# Patient Record
Sex: Male | Born: 1950
Health system: Southern US, Community
[De-identification: ages and names within clinical notes are randomized; demographics above are authoritative.]

## PROBLEM LIST (undated history)

## (undated) DIAGNOSIS — H409 Unspecified glaucoma: Secondary | ICD-10-CM

## (undated) DIAGNOSIS — E789 Disorder of lipoprotein metabolism, unspecified: Secondary | ICD-10-CM

## (undated) HISTORY — PX: CATARACT EXTRACTION, BILATERAL: SHX1313

## (undated) HISTORY — DX: Unspecified glaucoma: H40.9

## (undated) HISTORY — DX: Disorder of lipoprotein metabolism, unspecified: E78.9

---

## 2017-04-18 ENCOUNTER — Encounter: Payer: Self-pay | Admitting: *Deleted

## 2017-04-18 ENCOUNTER — Other Ambulatory Visit: Payer: Self-pay | Admitting: *Deleted

## 2017-04-18 NOTE — Patient Outreach (Signed)
[facility]) Care Management  04/18/2017  Oscar Melton Aug 14, 1950 [id number]   Health Risk Assessment call  Entry for 12/3  Successful outreach call to patient, explained reason for the call..  Patient discussed his medical condition of elevated cholesterol, he took medication until it ran out then he just stopped that was the only medication he was on, denies cost being a issue, discussed his bones began to ache a little.  Patient discussed he did not have a physical on last year only a colonoscopy .  Patient states he does plan to make an appointment with his PCP , Dr.Redding to get a physical in the new year, reviewed importance of follow up . Patient declines any other needs at this time. Will send patient letter with [facility] care management contact information .    Joylene Draft, RN, [place] Management Coordinator  [phone]- Mobile [phone]- Toll Free Main Office

## 2017-07-03 DIAGNOSIS — Z79899 Other long term (current) drug therapy: Secondary | ICD-10-CM | POA: Diagnosis not present

## 2017-07-03 DIAGNOSIS — Z6826 Body mass index (BMI) 26.0-26.9, adult: Secondary | ICD-10-CM | POA: Diagnosis not present

## 2017-07-03 DIAGNOSIS — Z1331 Encounter for screening for depression: Secondary | ICD-10-CM | POA: Diagnosis not present

## 2017-07-03 DIAGNOSIS — E785 Hyperlipidemia, unspecified: Secondary | ICD-10-CM | POA: Diagnosis not present

## 2017-07-03 DIAGNOSIS — Z23 Encounter for immunization: Secondary | ICD-10-CM | POA: Diagnosis not present

## 2017-07-03 DIAGNOSIS — K219 Gastro-esophageal reflux disease without esophagitis: Secondary | ICD-10-CM | POA: Diagnosis not present

## 2017-07-03 DIAGNOSIS — K635 Polyp of colon: Secondary | ICD-10-CM | POA: Diagnosis not present

## 2017-07-03 DIAGNOSIS — R972 Elevated prostate specific antigen [PSA]: Secondary | ICD-10-CM | POA: Diagnosis not present

## 2017-07-03 DIAGNOSIS — M1990 Unspecified osteoarthritis, unspecified site: Secondary | ICD-10-CM | POA: Diagnosis not present

## 2017-07-03 DIAGNOSIS — Z1339 Encounter for screening examination for other mental health and behavioral disorders: Secondary | ICD-10-CM | POA: Diagnosis not present

## 2017-07-03 DIAGNOSIS — D519 Vitamin B12 deficiency anemia, unspecified: Secondary | ICD-10-CM | POA: Diagnosis not present

## 2017-07-03 DIAGNOSIS — Z9181 History of falling: Secondary | ICD-10-CM | POA: Diagnosis not present

## 2017-07-10 DIAGNOSIS — D510 Vitamin B12 deficiency anemia due to intrinsic factor deficiency: Secondary | ICD-10-CM | POA: Diagnosis not present

## 2017-07-17 DIAGNOSIS — D510 Vitamin B12 deficiency anemia due to intrinsic factor deficiency: Secondary | ICD-10-CM | POA: Diagnosis not present

## 2017-07-24 DIAGNOSIS — D510 Vitamin B12 deficiency anemia due to intrinsic factor deficiency: Secondary | ICD-10-CM | POA: Diagnosis not present

## 2017-07-31 DIAGNOSIS — D510 Vitamin B12 deficiency anemia due to intrinsic factor deficiency: Secondary | ICD-10-CM | POA: Diagnosis not present

## 2017-08-30 DIAGNOSIS — D510 Vitamin B12 deficiency anemia due to intrinsic factor deficiency: Secondary | ICD-10-CM | POA: Diagnosis not present

## 2017-10-02 DIAGNOSIS — D510 Vitamin B12 deficiency anemia due to intrinsic factor deficiency: Secondary | ICD-10-CM | POA: Diagnosis not present

## 2017-10-31 DIAGNOSIS — D510 Vitamin B12 deficiency anemia due to intrinsic factor deficiency: Secondary | ICD-10-CM | POA: Diagnosis not present

## 2017-11-30 DIAGNOSIS — D510 Vitamin B12 deficiency anemia due to intrinsic factor deficiency: Secondary | ICD-10-CM | POA: Diagnosis not present

## 2017-12-26 DIAGNOSIS — H25043 Posterior subcapsular polar age-related cataract, bilateral: Secondary | ICD-10-CM | POA: Diagnosis not present

## 2017-12-26 DIAGNOSIS — H2512 Age-related nuclear cataract, left eye: Secondary | ICD-10-CM | POA: Diagnosis not present

## 2017-12-26 DIAGNOSIS — H18413 Arcus senilis, bilateral: Secondary | ICD-10-CM | POA: Diagnosis not present

## 2017-12-26 DIAGNOSIS — H02834 Dermatochalasis of left upper eyelid: Secondary | ICD-10-CM | POA: Diagnosis not present

## 2017-12-26 DIAGNOSIS — H25013 Cortical age-related cataract, bilateral: Secondary | ICD-10-CM | POA: Diagnosis not present

## 2017-12-26 DIAGNOSIS — H2513 Age-related nuclear cataract, bilateral: Secondary | ICD-10-CM | POA: Diagnosis not present

## 2017-12-29 DIAGNOSIS — D510 Vitamin B12 deficiency anemia due to intrinsic factor deficiency: Secondary | ICD-10-CM | POA: Diagnosis not present

## 2018-02-06 DIAGNOSIS — D510 Vitamin B12 deficiency anemia due to intrinsic factor deficiency: Secondary | ICD-10-CM | POA: Diagnosis not present

## 2018-02-26 DIAGNOSIS — H2513 Age-related nuclear cataract, bilateral: Secondary | ICD-10-CM | POA: Diagnosis not present

## 2018-02-26 DIAGNOSIS — H2512 Age-related nuclear cataract, left eye: Secondary | ICD-10-CM | POA: Diagnosis not present

## 2018-02-27 DIAGNOSIS — H2511 Age-related nuclear cataract, right eye: Secondary | ICD-10-CM | POA: Diagnosis not present

## 2018-03-12 DIAGNOSIS — H2511 Age-related nuclear cataract, right eye: Secondary | ICD-10-CM | POA: Diagnosis not present

## 2018-06-26 DIAGNOSIS — H401221 Low-tension glaucoma, left eye, mild stage: Secondary | ICD-10-CM | POA: Diagnosis not present

## 2018-06-26 DIAGNOSIS — H401212 Low-tension glaucoma, right eye, moderate stage: Secondary | ICD-10-CM | POA: Diagnosis not present

## 2018-07-04 DIAGNOSIS — K219 Gastro-esophageal reflux disease without esophagitis: Secondary | ICD-10-CM | POA: Diagnosis not present

## 2018-07-04 DIAGNOSIS — Z79899 Other long term (current) drug therapy: Secondary | ICD-10-CM | POA: Diagnosis not present

## 2018-07-04 DIAGNOSIS — Z23 Encounter for immunization: Secondary | ICD-10-CM | POA: Diagnosis not present

## 2018-07-04 DIAGNOSIS — Z6827 Body mass index (BMI) 27.0-27.9, adult: Secondary | ICD-10-CM | POA: Diagnosis not present

## 2018-07-04 DIAGNOSIS — H409 Unspecified glaucoma: Secondary | ICD-10-CM | POA: Diagnosis not present

## 2018-07-04 DIAGNOSIS — E785 Hyperlipidemia, unspecified: Secondary | ICD-10-CM | POA: Diagnosis not present

## 2018-07-04 DIAGNOSIS — Z0000 Encounter for general adult medical examination without abnormal findings: Secondary | ICD-10-CM | POA: Diagnosis not present

## 2018-07-04 DIAGNOSIS — Z2821 Immunization not carried out because of patient refusal: Secondary | ICD-10-CM | POA: Diagnosis not present

## 2018-07-04 DIAGNOSIS — M1990 Unspecified osteoarthritis, unspecified site: Secondary | ICD-10-CM | POA: Diagnosis not present

## 2018-07-04 DIAGNOSIS — Z125 Encounter for screening for malignant neoplasm of prostate: Secondary | ICD-10-CM | POA: Diagnosis not present

## 2018-07-04 DIAGNOSIS — D510 Vitamin B12 deficiency anemia due to intrinsic factor deficiency: Secondary | ICD-10-CM | POA: Diagnosis not present

## 2018-08-03 DIAGNOSIS — E538 Deficiency of other specified B group vitamins: Secondary | ICD-10-CM | POA: Diagnosis not present

## 2018-08-07 DIAGNOSIS — H401212 Low-tension glaucoma, right eye, moderate stage: Secondary | ICD-10-CM | POA: Diagnosis not present

## 2018-08-07 DIAGNOSIS — H401221 Low-tension glaucoma, left eye, mild stage: Secondary | ICD-10-CM | POA: Diagnosis not present

## 2018-10-11 DIAGNOSIS — D519 Vitamin B12 deficiency anemia, unspecified: Secondary | ICD-10-CM | POA: Diagnosis not present

## 2018-11-13 DIAGNOSIS — E538 Deficiency of other specified B group vitamins: Secondary | ICD-10-CM | POA: Diagnosis not present

## 2018-12-12 ENCOUNTER — Other Ambulatory Visit: Payer: Self-pay

## 2018-12-26 DIAGNOSIS — H401212 Low-tension glaucoma, right eye, moderate stage: Secondary | ICD-10-CM | POA: Diagnosis not present

## 2018-12-26 DIAGNOSIS — H401221 Low-tension glaucoma, left eye, mild stage: Secondary | ICD-10-CM | POA: Diagnosis not present

## 2018-12-26 DIAGNOSIS — L820 Inflamed seborrheic keratosis: Secondary | ICD-10-CM | POA: Diagnosis not present

## 2018-12-26 DIAGNOSIS — L821 Other seborrheic keratosis: Secondary | ICD-10-CM | POA: Diagnosis not present

## 2018-12-26 DIAGNOSIS — L814 Other melanin hyperpigmentation: Secondary | ICD-10-CM | POA: Diagnosis not present

## 2019-01-09 DIAGNOSIS — E538 Deficiency of other specified B group vitamins: Secondary | ICD-10-CM | POA: Diagnosis not present

## 2019-02-13 DIAGNOSIS — E538 Deficiency of other specified B group vitamins: Secondary | ICD-10-CM | POA: Diagnosis not present

## 2019-03-14 DIAGNOSIS — D510 Vitamin B12 deficiency anemia due to intrinsic factor deficiency: Secondary | ICD-10-CM | POA: Diagnosis not present

## 2019-04-22 DIAGNOSIS — D519 Vitamin B12 deficiency anemia, unspecified: Secondary | ICD-10-CM | POA: Diagnosis not present

## 2019-05-30 DIAGNOSIS — E538 Deficiency of other specified B group vitamins: Secondary | ICD-10-CM | POA: Diagnosis not present

## 2019-07-10 DIAGNOSIS — Z0000 Encounter for general adult medical examination without abnormal findings: Secondary | ICD-10-CM | POA: Diagnosis not present

## 2019-07-10 DIAGNOSIS — H409 Unspecified glaucoma: Secondary | ICD-10-CM | POA: Diagnosis not present

## 2019-07-10 DIAGNOSIS — Z125 Encounter for screening for malignant neoplasm of prostate: Secondary | ICD-10-CM | POA: Diagnosis not present

## 2019-07-10 DIAGNOSIS — K219 Gastro-esophageal reflux disease without esophagitis: Secondary | ICD-10-CM | POA: Diagnosis not present

## 2019-07-10 DIAGNOSIS — E7800 Pure hypercholesterolemia, unspecified: Secondary | ICD-10-CM | POA: Diagnosis not present

## 2019-07-10 DIAGNOSIS — Z6826 Body mass index (BMI) 26.0-26.9, adult: Secondary | ICD-10-CM | POA: Diagnosis not present

## 2019-07-10 DIAGNOSIS — F419 Anxiety disorder, unspecified: Secondary | ICD-10-CM | POA: Diagnosis not present

## 2019-07-10 DIAGNOSIS — R5383 Other fatigue: Secondary | ICD-10-CM | POA: Diagnosis not present

## 2019-07-10 DIAGNOSIS — Z1331 Encounter for screening for depression: Secondary | ICD-10-CM | POA: Diagnosis not present

## 2019-07-10 DIAGNOSIS — M1990 Unspecified osteoarthritis, unspecified site: Secondary | ICD-10-CM | POA: Diagnosis not present

## 2019-07-10 DIAGNOSIS — Z79899 Other long term (current) drug therapy: Secondary | ICD-10-CM | POA: Diagnosis not present

## 2019-07-10 DIAGNOSIS — D510 Vitamin B12 deficiency anemia due to intrinsic factor deficiency: Secondary | ICD-10-CM | POA: Diagnosis not present

## 2019-07-20 ENCOUNTER — Emergency Department (HOSPITAL_COMMUNITY)
Admission: EM | Admit: 2019-07-20 | Discharge: 2019-07-21 | Disposition: A | Discharge status: Home / Self Care | Payer: PPO | Attending: Emergency Medicine | Admitting: Emergency Medicine

## 2019-07-20 ENCOUNTER — Emergency Department (HOSPITAL_COMMUNITY): Payer: PPO

## 2019-07-20 ENCOUNTER — Encounter (HOSPITAL_COMMUNITY): Payer: Self-pay

## 2019-07-20 DIAGNOSIS — R55 Syncope and collapse: Secondary | ICD-10-CM | POA: Diagnosis not present

## 2019-07-20 DIAGNOSIS — W19XXXA Unspecified fall, initial encounter: Secondary | ICD-10-CM | POA: Diagnosis not present

## 2019-07-20 DIAGNOSIS — Y998 Other external cause status: Secondary | ICD-10-CM | POA: Insufficient documentation

## 2019-07-20 DIAGNOSIS — S0003XA Contusion of scalp, initial encounter: Secondary | ICD-10-CM | POA: Diagnosis not present

## 2019-07-20 DIAGNOSIS — Y9289 Other specified places as the place of occurrence of the external cause: Secondary | ICD-10-CM | POA: Insufficient documentation

## 2019-07-20 DIAGNOSIS — Y9389 Activity, other specified: Secondary | ICD-10-CM | POA: Insufficient documentation

## 2019-07-20 DIAGNOSIS — S0101XA Laceration without foreign body of scalp, initial encounter: Secondary | ICD-10-CM | POA: Diagnosis not present

## 2019-07-20 DIAGNOSIS — Z79899 Other long term (current) drug therapy: Secondary | ICD-10-CM | POA: Insufficient documentation

## 2019-07-20 DIAGNOSIS — R42 Dizziness and giddiness: Secondary | ICD-10-CM | POA: Diagnosis not present

## 2019-07-20 DIAGNOSIS — S0990XA Unspecified injury of head, initial encounter: Secondary | ICD-10-CM | POA: Diagnosis present

## 2019-07-20 DIAGNOSIS — R569 Unspecified convulsions: Secondary | ICD-10-CM | POA: Diagnosis not present

## 2019-07-20 DIAGNOSIS — I10 Essential (primary) hypertension: Secondary | ICD-10-CM | POA: Diagnosis not present

## 2019-07-20 DIAGNOSIS — S199XXA Unspecified injury of neck, initial encounter: Secondary | ICD-10-CM | POA: Diagnosis not present

## 2019-07-20 DIAGNOSIS — R0902 Hypoxemia: Secondary | ICD-10-CM | POA: Diagnosis not present

## 2019-07-20 DIAGNOSIS — R4020 Unspecified coma: Secondary | ICD-10-CM | POA: Diagnosis not present

## 2019-07-20 DIAGNOSIS — R410 Disorientation, unspecified: Secondary | ICD-10-CM | POA: Diagnosis not present

## 2019-07-20 LAB — CBC
HCT: 48.6 % (ref 39.0–52.0)
Hemoglobin: 15.4 g/dL (ref 13.0–17.0)
MCH: 29.3 pg (ref 26.0–34.0)
MCHC: 31.7 g/dL (ref 30.0–36.0)
MCV: 92.4 fL (ref 80.0–100.0)
Platelets: 177 10*3/uL (ref 150–400)
RBC: 5.26 MIL/uL (ref 4.22–5.81)
RDW: 13.3 % (ref 11.5–15.5)
WBC: 15.3 10*3/uL — ABNORMAL HIGH (ref 4.0–10.5)
nRBC: 0 % (ref 0.0–0.2)

## 2019-07-20 LAB — COMPREHENSIVE METABOLIC PANEL
ALT: 17 U/L (ref 0–44)
AST: 17 U/L (ref 15–41)
Albumin: 3.6 g/dL (ref 3.5–5.0)
Alkaline Phosphatase: 63 U/L (ref 38–126)
Anion gap: 13 (ref 5–15)
BUN: 9 mg/dL (ref 8–23)
CO2: 20 mmol/L — ABNORMAL LOW (ref 22–32)
Calcium: 8.9 mg/dL (ref 8.9–10.3)
Chloride: 107 mmol/L (ref 98–111)
Creatinine, Ser: 1.09 mg/dL (ref 0.61–1.24)
GFR calc Af Amer: >60 mL/min (ref >60)
GFR calc non Af Amer: >60 mL/min (ref >60)
Glucose, Bld: 184 mg/dL — ABNORMAL HIGH (ref 70–99)
Potassium: 3.3 mmol/L — ABNORMAL LOW (ref 3.5–5.1)
Sodium: 140 mmol/L (ref 135–145)
Total Bilirubin: 0.8 mg/dL (ref 0.3–1.2)
Total Protein: 6.6 g/dL (ref 6.5–8.1)

## 2019-07-20 LAB — URINALYSIS, ROUTINE W REFLEX MICROSCOPIC
Bilirubin Urine: NEGATIVE
Glucose, UA: NEGATIVE mg/dL
Hgb urine dipstick: NEGATIVE
Ketones, ur: NEGATIVE mg/dL
Leukocytes,Ua: NEGATIVE
Nitrite: NEGATIVE
Protein, ur: NEGATIVE mg/dL
Specific Gravity, Urine: 1.014 (ref 1.005–1.030)
pH: 5 (ref 5.0–8.0)

## 2019-07-20 LAB — CBG MONITORING, ED: Glucose-Capillary: 169 mg/dL — ABNORMAL HIGH (ref 70–99)

## 2019-07-20 MED ORDER — SODIUM CHLORIDE 0.9% FLUSH
3.0000 mL | Freq: Once | INTRAVENOUS | Status: AC
  Administered 2019-07-20: 22:00:00 3 mL via INTRAVENOUS

## 2019-07-20 NOTE — ED Provider Notes (Signed)
[facility] EMERGENCY DEPARTMENT Provider Note   CSN: [id number] Arrival date & time: 07/20/19  2159     History No chief complaint on file.   Oscar Melton is a 69 y.o. male.  HPI     This is a 69 year old male with a history of hypercholesterolemia who presents following a fall.  Patient reports that he was at work.  He does not recall events or the fall.  He states that he did begin to feel "swimmy headed" and felt like he needed to sit down.  He states he has had events like this in the past.  He will feel "swimmy headed" and feel like shots of light in his head.  He denies any headache, chest pain, shortness of breath.  He was found by coworkers on the ground.  Initially he was disoriented but now is oriented x4.  He denies any other pain including extremity pain.  He does report some discomfort over the posterior part of his head where he has a lacerations.  He rates his pain at 5 out of 10.  Unknown last tetanus shot.  No history of syncope or seizure.  States that he has generally felt well recently and denies any recent illness or upper respiratory symptoms.  History reviewed. No pertinent past medical history.  There are no problems to display for this patient.   History reviewed. No pertinent surgical history.     No family history on file.  Social History   Tobacco Use   Smoking status: Not on file  Substance Use Topics   Alcohol use: Not on file   Drug use: Not on file    Home Medications Prior to Admission medications   Medication Sig Start Date End Date Taking? Authorizing Provider  Multiple Vitamin (MULTIVITAMIN WITH MINERALS) TABS tablet Take 1 tablet by mouth daily.   Yes [provider]  pseudoephedrine-acetaminophen (TYLENOL SINUS) 30-500 MG TABS tablet Take 1 tablet by mouth every 4 (four) hours as needed (for congestion).   Yes [provider]  rosuvastatin (CRESTOR) 5 MG tablet Take 5 mg by mouth at bedtime.   07/04/19  Yes [provider]    Allergies    Patient has no known allergies.  Review of Systems   Review of Systems  Constitutional: Negative for fever.  Respiratory: Negative for shortness of breath.   Cardiovascular: Negative for chest pain.  Gastrointestinal: Negative for abdominal pain, nausea and vomiting.  Genitourinary: Negative for dysuria.  Skin: Positive for wound.  Neurological: Positive for syncope and light-headedness. Negative for seizures and headaches.  All other systems reviewed and are negative.   Physical Exam Updated Vital Signs BP (!) 142/87    Pulse 75    Temp 97.7 F (36.5 C) (Oral)    Resp 15    Ht 1.676 m (5\' 6" )    Wt 77.1 kg    SpO2 97%    BMI 27.44 kg/m   Physical Exam Vitals and nursing note reviewed.  Constitutional:      Appearance: He is well-developed.     Comments: Alert and oriented, no acute distress, ABCs intact  HENT:     Head: Normocephalic.     Comments: Laceration posterior scalp, gaping, no significant bleeding    Nose: Nose normal.     Comments: Dried blood noted over the face    Mouth/Throat:     Mouth: Mucous membranes are moist.  Eyes:     Extraocular Movements: Extraocular movements intact.  Conjunctiva/sclera: Conjunctivae normal.     Pupils: Pupils are equal, round, and reactive to light.  Neck:     Comments: C-collar in place Cardiovascular:     Rate and Rhythm: Normal rate and regular rhythm.     Heart sounds: Normal heart sounds. No murmur.  Pulmonary:     Effort: Pulmonary effort is normal. No respiratory distress.     Breath sounds: Normal breath sounds. No wheezing.  Abdominal:     General: Bowel sounds are normal.     Palpations: Abdomen is soft.     Tenderness: There is no abdominal tenderness. There is no rebound.  Musculoskeletal:        General: No tenderness, deformity or signs of injury.     Cervical back: Neck supple.     Comments: Normal range of motion of all 4 extremities, no obvious  deformities  Skin:    General: Skin is warm and dry.  Neurological:     Mental Status: He is alert and oriented to person, place, and time.     Comments: 5 out of 5 strength in all 4 extremities, no dysmetria to finger-nose-finger, cranial nerves II through XII intact  Psychiatric:        Mood and Affect: Mood normal.     ED Results / Procedures / Treatments   Labs (all labs ordered are listed, but only abnormal results are displayed) Labs Reviewed  COMPREHENSIVE METABOLIC PANEL - Abnormal; Notable for the following components:      Result Value   Potassium 3.3 (*)    CO2 20 (*)    Glucose, Bld 184 (*)    All other components within normal limits  CBC - Abnormal; Notable for the following components:   WBC 15.3 (*)    All other components within normal limits  URINALYSIS, ROUTINE W REFLEX MICROSCOPIC - Abnormal; Notable for the following components:   APPearance HAZY (*)    All other components within normal limits  CBG MONITORING, ED - Abnormal; Notable for the following components:   Glucose-Capillary 169 (*)    All other components within normal limits  RAPID URINE DRUG SCREEN, HOSP PERFORMED    EKG None  Radiology CT Head Wo Contrast  Result Date: 07/20/2019 CLINICAL DATA:  Unwitnessed fall EXAM: CT HEAD WITHOUT CONTRAST CT CERVICAL SPINE WITHOUT CONTRAST TECHNIQUE: Multidetector CT imaging of the head and cervical spine was performed following the standard protocol without intravenous contrast. Multiplanar CT image reconstructions of the cervical spine were also generated. COMPARISON:  None. FINDINGS: CT HEAD FINDINGS Brain: No evidence of acute infarction, hemorrhage, hydrocephalus, extra-axial collection or mass lesion/mass effect. Symmetric prominence of the ventricles, cisterns and sulci compatible with parenchymal volume loss. Patchy areas of white matter hypoattenuation are most compatible with chronic microvascular angiopathy. 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 Minimal atheromatous plaque in the carotid and vertebrobasilar system as well. No focal aneurysm or outpouching is evident though evaluation is certainly limited on a non contrasted CT study. Skull: There is right occipital scalp swelling and crescentic hematoma with overlying laceration. Punctate radiodensity in the midline posterior scalp (3/29) may reflect a small amount of radiodense debris in the soft tissues. Overlying bandaging material is present. No subjacent calvarial fracture or suspicious osseous lesion. Sinuses/Orbits: Paranasal sinuses and mastoid air cells are predominantly clear. Orbital structures are unremarkable aside from prior lens extractions. Other: Multiple carious lesions of the imaged dentition. CT CERVICAL SPINE FINDINGS Alignment: Slight straightening of the normal upper  cervical lordosis. Finding likely on a degenerative basis with mild spondylitic changes at these levels. No traumatic listhesis. No abnormally widened, perched or jumped facets. Craniocervical and atlantoaxial articulations are present. Skull base and vertebrae: No acute fracture. No primary bone lesion or focal pathologic process. Soft tissues and spinal canal: No pre or paravertebral fluid or swelling. No visible canal hematoma. Disc levels: Mild intervertebral disc height loss most pronounced C4-5 and C6-7 with posterior disc osteophyte complexes at these levels resulting in at most mild canal stenosis. Uncinate spurring and facet arthropathic changes are present without significant neural foraminal narrowing. Upper chest: No acute abnormality in the upper chest or imaged lung apices. Other: Normal thyroid. IMPRESSION: 1. No evidence of an acute intracranial process. 2. Right occipital scalp swelling and hematoma with overlying laceration. Punctate radiodensity in the midline posterior scalp may reflect a small amount of radiodense debris in the soft tissues. No subjacent  calvarial fracture 3. No evidence of acute fracture malalignment of the cervical spine. 4. Mild degenerative disc and facet disease. 5. 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 Can be seen as sequela of chronic hypertension or atherosclerotic disease. 6. Multiple carious lesions of the imaged dentition. Correlate with dental exam. Electronically Signed   By: Lovena Le M.D.   On: 07/20/2019 23:53   CT Cervical Spine Wo Contrast  Result Date: 07/20/2019 CLINICAL DATA:  Unwitnessed fall EXAM: CT HEAD WITHOUT CONTRAST CT CERVICAL SPINE WITHOUT CONTRAST TECHNIQUE: Multidetector CT imaging of the head and cervical spine was performed following the standard protocol without intravenous contrast. Multiplanar CT image reconstructions of the cervical spine were also generated. COMPARISON:  None. FINDINGS: CT HEAD FINDINGS Brain: No evidence of acute infarction, hemorrhage, hydrocephalus, extra-axial collection or mass lesion/mass effect. Symmetric prominence of the ventricles, cisterns and sulci compatible with parenchymal volume loss. Patchy areas of white matter hypoattenuation are most compatible with chronic microvascular angiopathy. 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 Minimal atheromatous plaque in the carotid and vertebrobasilar system as well. No focal aneurysm or outpouching is evident though evaluation is certainly limited on a non contrasted CT study. Skull: There is right occipital scalp swelling and crescentic hematoma with overlying laceration. Punctate radiodensity in the midline posterior scalp (3/29) may reflect a small amount of radiodense debris in the soft tissues. Overlying bandaging material is present. No subjacent calvarial fracture or suspicious osseous lesion. Sinuses/Orbits: Paranasal sinuses and mastoid air cells are predominantly clear. Orbital structures are unremarkable aside from prior lens extractions. Other: Multiple carious  lesions of the imaged dentition. CT CERVICAL SPINE FINDINGS Alignment: Slight straightening of the normal upper cervical lordosis. Finding likely on a degenerative basis with mild spondylitic changes at these levels. No traumatic listhesis. No abnormally widened, perched or jumped facets. Craniocervical and atlantoaxial articulations are present. Skull base and vertebrae: No acute fracture. No primary bone lesion or focal pathologic process. Soft tissues and spinal canal: No pre or paravertebral fluid or swelling. No visible canal hematoma. Disc levels: Mild intervertebral disc height loss most pronounced C4-5 and C6-7 with posterior disc osteophyte complexes at these levels resulting in at most mild canal stenosis. Uncinate spurring and facet arthropathic changes are present without significant neural foraminal narrowing. Upper chest: No acute abnormality in the upper chest or imaged lung apices. Other: Normal thyroid. IMPRESSION: 1. No evidence of an acute intracranial process. 2. Right occipital scalp swelling and hematoma with overlying laceration. Punctate radiodensity in the midline posterior scalp may reflect a small amount  of radiodense debris in the soft tissues. No subjacent calvarial fracture 3. No evidence of acute fracture malalignment of the cervical spine. 4. Mild degenerative disc and facet disease. 5. 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 Can be seen as sequela of chronic hypertension or atherosclerotic disease. 6. Multiple carious lesions of the imaged dentition. Correlate with dental exam. Electronically Signed   By: Lovena Le M.D.   On: 07/20/2019 23:53    Procedures Procedures (including critical care time)  Medications Ordered in ED Medications  sodium chloride flush (NS) 0.9 % injection 3 mL (3 mLs Intravenous Given 07/20/19 2217)  lidocaine-EPINEPHrine (XYLOCAINE W/EPI) 2 %-1:200000 (PF) injection 10 mL (10 mLs Infiltration Given 07/21/19 0253)    ED  Course  I have reviewed the triage vital signs and the nursing notes.  Pertinent labs & imaging results that were available during my care of the patient were reviewed by me and considered in my medical decision making (see chart for details).    MDM Rules/Calculators/A&P                       Patient presents after an episode of loss of consciousness.  He is overall nontoxic and vital signs are reassuring.  He is alert and oriented for me although it sounds that he was initially confused.  Patient reports prodrome to his episode of loss of consciousness.  This would suggest likely vasovagal syncope although given confusion following the episode, seizure would also be consideration.  No evidence of arrhythmia on EKG or cardiac monitoring while in the emergency department.  Lab work reviewed are reassuring.  UDS is negative for drugs.  He is neurologically intact and CT head does not show any evidence of acute bleed.  Laceration was repaired by the PA.  Patient was ambulatory independently without any recurrence of dizziness.  Given reassuring work-up here, feel he can follow-up with his primary doctor as an outpatient.  He will need likely work-up for seizure versus syncope.  Advised that he should not drive or operate heavy machinery until he is cleared.  After history, exam, and medical workup I feel the patient has been appropriately medically screened and is safe for discharge home. Pertinent diagnoses were discussed with the patient. Patient was given return precautions.   Final Clinical Impression(s) / ED Diagnoses Final diagnoses:  Loss of consciousness ([place])  Laceration of occipital scalp, initial encounter    Rx / DC Orders ED Discharge Orders    None       Caydyn Sprung, Barbette Hair, MD 07/21/19 [phone]

## 2019-07-20 NOTE — ED Triage Notes (Addendum)
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 Pt does not remember the fall. Coworkers found patient on the ground. When EMS arrived they reported a large amount of blood on the floor coming from his head. EMS stated pt was altered but started to become more alert in route. Pt was inially A/Ox2, and now is A/Ox3. When EMS got patient on stretcher, the patient did have one episode of vomiting. Does not state he is on any blood thinners. EMS placed pt in a C-Collar. Pt is complaining of back pain.  153CBG, 142/86, 92HR. 20GRFA, 4mg  Zofran given in route.

## 2019-07-20 NOTE — ED Notes (Addendum)
Pt does have an approx 2-3in lac to the posterior occipital region of his head. Bleeding is controlled at this time.

## 2019-07-21 LAB — RAPID URINE DRUG SCREEN, HOSP PERFORMED
Amphetamines: NOT DETECTED
Barbiturates: NOT DETECTED
Benzodiazepines: NOT DETECTED
Cocaine: NOT DETECTED
Opiates: NOT DETECTED
Tetrahydrocannabinol: NOT DETECTED

## 2019-07-21 MED ORDER — LIDOCAINE-EPINEPHRINE (PF) 2 %-1:200000 IJ SOLN
10.0000 mL | Freq: Once | INTRAMUSCULAR | Status: AC
  Administered 2019-07-21: 10 mL
  Filled 2019-07-21: qty 20

## 2019-07-21 NOTE — ED Provider Notes (Signed)
LACERATION REPAIR Performed by: Dewaine Oats Authorized by: Dewaine Oats Consent: Verbal consent obtained. Risks and benefits: risks, benefits and alternatives were discussed Consent given by: patient Patient identity confirmed: provided demographic data Prepped and Draped in normal sterile fashion Wound explored  Laceration Location: POSTERIOR scalp    Laceration Length: 3 cm  No Foreign Bodies seen or palpated  Anesthesia: local infiltration  Local anesthetic: lidocaine 2% W/epinephrine  Anesthetic total: 2 ml  Irrigation method: syringe Amount of cleaning: standard  Skin closure: STAPLES  Number of sutures: 4  Technique: STAPLES   Patient tolerance: Patient tolerated the procedure well with no immediate complications.    Charlann Lange, PA-C 07/21/19 [id number]    Merryl Hacker, MD 07/21/19 [phone]

## 2019-07-21 NOTE — Discharge Instructions (Signed)
You were seen today for an episode of loss of consciousness.  This is likely what we would call a syncopal episode.  However, seizure is also a consideration.  Your work-up here is reassuring.  You need to follow-up with both cardiology and neurology for formal evaluation.  Regarding your laceration care, you may wash your hair as normal.  Have staples removed in 10 days.  You should not drive or operate heavy machinery until cleared by cardiology and neurology.

## 2019-07-21 NOTE — ED Notes (Signed)
Pt ambulated in hall. Pt also made his way to restroom. Pt had no issues with balance or gait. Pt did not verbalize any issues with ambulation.

## 2019-07-21 NOTE — ED Notes (Signed)
Pt ambulated a few steps in room towards door, stated increased dizziness, and seated back on his bed.

## 2019-07-21 NOTE — ED Notes (Signed)
Patient verbalizes understanding of discharge instructions. Opportunity for questioning and answers were provided. Armband removed by staff, pt discharged from ED. Pt. ambulatory and discharged home.  

## 2019-07-22 ENCOUNTER — Encounter: Payer: Self-pay | Admitting: Neurology

## 2019-07-30 DIAGNOSIS — E538 Deficiency of other specified B group vitamins: Secondary | ICD-10-CM | POA: Diagnosis not present

## 2019-08-20 ENCOUNTER — Other Ambulatory Visit: Payer: Self-pay

## 2019-08-20 ENCOUNTER — Ambulatory Visit (INDEPENDENT_AMBULATORY_CARE_PROVIDER_SITE_OTHER): Payer: PPO | Admitting: Neurology

## 2019-08-20 ENCOUNTER — Encounter: Payer: Self-pay | Admitting: Neurology

## 2019-08-20 VITALS — BP 172/88 | HR 55 | Ht 66.0 in | Wt 172.4 lb

## 2019-08-20 DIAGNOSIS — R55 Syncope and collapse: Secondary | ICD-10-CM

## 2019-08-20 DIAGNOSIS — R42 Dizziness and giddiness: Secondary | ICD-10-CM | POA: Diagnosis not present

## 2019-08-20 DIAGNOSIS — R404 Transient alteration of awareness: Secondary | ICD-10-CM | POA: Diagnosis not present

## 2019-08-20 NOTE — Patient Instructions (Addendum)
1. Schedule MRI brain with and without contrast  2. Schedule 1-hour EEG, if normal, we will plan for a 48-hour EEG  3. As per [place] driving laws, no driving after an episode of loss of consciousness, until 6 months event-free.   4. If dizziness continues, would do physical therapy (vestibular therapy)  5. Follow-up after tests, call for any changes

## 2019-08-20 NOTE — Progress Notes (Signed)
NEUROLOGY CONSULTATION NOTE  Oscar Melton MRN: [id number] DOB: 1950-11-13  Referring provider: Dr. Lovette Cliche Primary care provider: Dr. Lovette Cliche  Reason for consult:  Seizure versus syncope  Dear Dr Lin Landsman:  Thank you for your kind referral of Oscar Melton for consultation of the above symptoms. Although his history is well known to you, please allow me to reiterate it for the purpose of our medical record. He is alone in the office today. Records and images were personally reviewed where available.   HISTORY OF PRESENT ILLNESS: This is a pleasant 69 year old right-handed man with a history of hyperlipidemia, in his usual state of health until 07/20/2019 when he had an episode of loss of consciousness at work. He recalls sitting at the break room and eating a couple of cupcakes, getting up, then has no recollection of events. He denies any prior warning symptoms, however in the ER reported that he began to feel "swimmy headed" and felt like he needed to sit down. When he feels this sensation, it feels like shots of light happen in his head. His coworkers say he was walking in the hallway holding his shoes in his hands and not making sense. He was apparently trying to make a call in the ambulance but does not recall talking to family. Coworkers reported he had vomited. He had a laceration in the posterior scalp requiring staples. Bloodwork showed a WBC of 15.3, K 3.3, UA and UDS negative. I personally reviewed head CT which did not show any acute intracranial abnormalities, there was right occipital scalp swelling and hematoma. 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   He reports the flashes in his head have been ongoing since 2005. He suddenly has flashes in his eyes/head, like strobe lights. He feels like he cannot focus and cannot figure things out. He cannot see well when they happen. There are no associated headache, focal symptoms. He mostly  notices them when he goes in a store in [place] around once a month and has to sit down. He states they don't occur at home. He reports a lot of anxiety, he recalls when he was in the 6th grade in 1967, he went to bed and got up "nervous as a cat ever since." He is shaky at different times, sometimes affecting his handwriting. No tremors today. He lives alone and denies being told of any staring/unresponsive episodes. He has had dizzy spells since the fall, with a spinning sensation. When he lays down his head feels like it is running around inside. He denies any neck/back pain, bowel/bladder dysfunction. Sleep is fairly good. Memory is not as good. He had a normal birth and early development.  There is no history of febrile convulsions, CNS infections such as meningitis/encephalitis, significant traumatic brain injury, neurosurgical procedures, or family history of seizures.    PAST MEDICAL HISTORY: Past Medical History:  Diagnosis Date  . Borderline high cholesterol   . Glaucoma     PAST SURGICAL HISTORY: Past Surgical History:  Procedure Laterality Date  . CATARACT EXTRACTION, BILATERAL      MEDICATIONS: Current Outpatient Medications on File Prior to Visit  Medication Sig Dispense Refill  . Ascorbic Acid (VITAMIN C) 100 MG tablet Take 500 mg by mouth daily.    . Cyanocobalamin (B-12 COMPLIANCE INJECTION IJ) Inject as directed every 30 (thirty) days.    . famotidine (ACID CONTROLLER MAX ST) 20 MG tablet Take 20 mg by mouth daily.    Marland Kitchen  Multiple Vitamin (MULTIVITAMIN WITH MINERALS) TABS tablet Take 1 tablet by mouth daily.    . Multiple Vitamins-Minerals (EYE HEALTH PO) Take by mouth.    . pseudoephedrine-acetaminophen (TYLENOL SINUS) 30-500 MG TABS tablet Take 1 tablet by mouth every 4 (four) hours as needed (for congestion).    . rosuvastatin (CRESTOR) 5 MG tablet Take 5 mg by mouth at bedtime.     . Turmeric (QC TUMERIC COMPLEX) 500 MG CAPS Take by mouth.    . Zinc 50 MG CAPS Take  50 mg by mouth.     No current facility-administered medications on file prior to visit.    ALLERGIES: No Known Allergies  FAMILY HISTORY: Family History  Problem Relation Age of Onset  . Cancer - Colon Mother   . AAA (abdominal aortic aneurysm) Father   . Stroke Sister   . Heart attack Sister   . Suicidality Brother   . Stroke Maternal Grandmother     SOCIAL HISTORY: Social History   Socioeconomic History  . Marital status: Single    Spouse name: Not on file  . Number of children: Not on file  . Years of education: Not on file  . Highest education level: Not on file  Occupational History  . Not on file  Tobacco Use  . Smoking status: Former Research scientist (life sciences)  . Smokeless tobacco: Never Used  Substance and Sexual Activity  . Alcohol use: Never  . Drug use: Not on file  . Sexual activity: Never  Other Topics Concern  . Not on file  Social History Narrative   Right handed    Lives alone in apartment    Social Determinants of Health   Financial Resource Strain:   . Difficulty of Paying Living Expenses:   Food Insecurity:   . Worried About Charity fundraiser in the Last Year:   . Arboriculturist in the Last Year:   Transportation Needs:   . Film/video editor (Medical):   Marland Kitchen Lack of Transportation (Non-Medical):   Physical Activity:   . Days of Exercise per Week:   . Minutes of Exercise per Session:   Stress:   . Feeling of Stress :   Social Connections:   . Frequency of Communication with Friends and Family:   . Frequency of Social Gatherings with Friends and Family:   . Attends Religious Services:   . Active Member of Clubs or Organizations:   . Attends Archivist Meetings:   Marland Kitchen Marital Status:   Intimate Partner Violence:   . Fear of Current or Ex-Partner:   . Emotionally Abused:   Marland Kitchen Physically Abused:   . Sexually Abused:     REVIEW OF SYSTEMS: Constitutional: No fevers, chills, or sweats, no generalized fatigue, change in appetite Eyes: No  visual changes, double vision, eye pain Ear, nose and throat: No hearing loss, ear pain, nasal congestion, sore throat Cardiovascular: No chest pain, palpitations Respiratory:  No shortness of breath at rest or with exertion, wheezes GastrointestinaI: No nausea, vomiting, diarrhea, abdominal pain, fecal incontinence Genitourinary:  No dysuria, urinary retention or frequency Musculoskeletal:  No neck pain, back pain Integumentary: No rash, pruritus, skin lesions Neurological: as above Psychiatric: No depression, insomnia,+ anxiety Endocrine: No palpitations, fatigue, diaphoresis, mood swings, change in appetite, change in weight, increased thirst Hematologic/Lymphatic:  No anemia, purpura, petechiae. Allergic/Immunologic: no itchy/runny eyes, nasal congestion, recent allergic reactions, rashes  PHYSICAL EXAM: Vitals:   08/20/19 1248  BP: (!) 172/88  Pulse: (!) 55  SpO2: 98%   General: No acute distress, some anxiety Head:  Normocephalic/atraumatic Skin/Extremities: No rash, no edema Neurological Exam: Mental status: alert and oriented to person, place, and time, no dysarthria or aphasia, Fund of knowledge is appropriate.  Recent and remote memory are intact. 2/3 delayed recall. Attention and concentration are normal.    Able to name objects and repeat phrases. Cranial nerves: CN I: not tested CN II: pupils equal, round and reactive to light, visual fields intact CN III, IV, VI:  full range of motion, no nystagmus, no ptosis CN V: facial sensation intact CN VII: upper and lower face symmetric CN VIII: hearing intact to conversation Bulk & Tone: normal, no fasciculations. Motor: 5/5 throughout with no pronator drift. Sensation: intact to light touch, cold, pin, vibration and joint position sense.  No extinction to double simultaneous stimulation.  Romberg test negative Deep Tendon Reflexes: +1 throughout, no ankle clonus Cerebellar: no incoordination on finger to nose testing Gait:  narrow-based and steady, mild difficulty with tandem walk Tremor: none in office today   IMPRESSION: This is a 69 year old right-handed man with a history of hyperlipidemia, presenting for evaluation of an episode of loss of consciousness last 07/20/19. He has been having recurrent episodes since 2005 of seeing flashing lights with associated confusion, and feels this may have happened prior to the recent syncopal event. His neurological exam is normal. We discussed differential diagnosis, syncope versus seizure. With recurrent episodes of confusion, would do further workup with MRI brain with and without contrast and 1-hour EEG. If normal, we will plan for a 72-hour EEG to further classify symptoms.  [place] driving laws were discussed with the patient, and he knows to stop driving after an episode of loss of consciousness, until 6 months event-free. He is also reporting positional vertigo since the fall, would benefit from vestibular therapy if symptoms persist, he would like to hold off for now. Follow-up after tests, he knows to call for any changes.  Thank you for allowing me to participate in the care of this patient. Please do not hesitate to call for any questions or concerns.   Ellouise Newer, M.D.  CC: Dr. Lin Landsman

## 2019-08-28 ENCOUNTER — Other Ambulatory Visit: Payer: Self-pay

## 2019-08-28 ENCOUNTER — Ambulatory Visit (INDEPENDENT_AMBULATORY_CARE_PROVIDER_SITE_OTHER): Payer: PPO | Admitting: Neurology

## 2019-08-28 DIAGNOSIS — R55 Syncope and collapse: Secondary | ICD-10-CM | POA: Diagnosis not present

## 2019-08-28 DIAGNOSIS — R404 Transient alteration of awareness: Secondary | ICD-10-CM

## 2019-09-04 DIAGNOSIS — E538 Deficiency of other specified B group vitamins: Secondary | ICD-10-CM | POA: Diagnosis not present

## 2019-09-09 ENCOUNTER — Ambulatory Visit: Payer: PPO | Admitting: Neurology

## 2019-09-09 NOTE — Procedures (Signed)
ELECTROENCEPHALOGRAM REPORT  Date of Study: 08/28/2019  Patient's Name: Oscar Melton MRN: [id number] Date of Birth: 15-Nov-1950  Referring Provider: Dr. Ellouise Newer  Clinical History: This is a 70 year old man with an episode of loss of consciousness last 07/2019. He also has recurrent episodes of seeing flashing lights with associated confusion.   Medications: VITAMIN C 100 MG tablet B-12 COMPLIANCE INJECTION IJ ACID CONTROLLER MAX ST 20 MG tablet MULTIVITAMIN WITH MINERALS TABS EYE HEALTH PO TYLENOL SINUS 30-500 MG TABS QC TUMERIC COMPLEX 500 MG CAPS Zinc 50 MG CAPS  Technical Summary: A multichannel digital 1-hour EEG recording measured by the international 10-20 system with electrodes applied with paste and impedances below 5000 ohms performed in our laboratory with EKG monitoring in an awake and asleep patient.  Hyperventilation was not performed. Photic stimulation was performed.  The digital EEG was referentially recorded, reformatted, and digitally filtered in a variety of bipolar and referential montages for optimal display.    Description: The patient is awake and asleep during the recording.  During maximal wakefulness, there is a symmetric, medium to high voltage 10 Hz posterior dominant rhythm that attenuates with eye opening.  The record is symmetric.  During drowsiness and sleep, there is an increase in theta slowing of the background.  Vertex waves and symmetric sleep spindles were seen.  Photic stimulation did not elicit any abnormalities.  There were rare sharp waves seen over the left temporal region, with phase reversal over T7. There were no electrographic seizures seen.    EKG lead showed occasional extrasystolic beats.   Impression: This 1-hour awake and asleep EEG is abnormal due to the presence of rare sharp waves over the left temporal region.  Clinical Correlation of the above findings indicates a possible tendency for seizures to arise from the left  temporal region. If further clinical questions remain, prolonged EEG may be helpful.  Clinical correlation is advised.   Ellouise Newer, M.D.

## 2019-09-10 ENCOUNTER — Telehealth: Payer: Self-pay | Admitting: Neurology

## 2019-09-10 ENCOUNTER — Other Ambulatory Visit: Payer: Self-pay

## 2019-09-10 ENCOUNTER — Ambulatory Visit
Admission: RE | Admit: 2019-09-10 | Discharge: 2019-09-10 | Disposition: A | Discharge status: Home / Self Care | Payer: PPO | Source: Ambulatory Visit | Attending: Neurology | Admitting: Neurology

## 2019-09-10 DIAGNOSIS — R404 Transient alteration of awareness: Secondary | ICD-10-CM

## 2019-09-10 DIAGNOSIS — R42 Dizziness and giddiness: Secondary | ICD-10-CM | POA: Diagnosis not present

## 2019-09-10 DIAGNOSIS — R55 Syncope and collapse: Secondary | ICD-10-CM

## 2019-09-10 MED ORDER — GADOBENATE DIMEGLUMINE 529 MG/ML IV SOLN
17.0000 mL | Freq: Once | INTRAVENOUS | Status: AC | PRN
  Administered 2019-09-10: 17 mL via INTRAVENOUS

## 2019-09-10 NOTE — Telephone Encounter (Signed)
Discussed results of tests. MRI brain was unremarkable. His EEG had shown rare sharp waves over the left temporal region, would proceed with prolonged EEG as planned to further classify his symptoms before we decide to start seizure medication. All his questions were answered.

## 2019-09-10 NOTE — Telephone Encounter (Signed)
Left VM to discuss results of EEG and MRI.

## 2019-09-10 NOTE — Telephone Encounter (Signed)
Patient left message with [facility]:  "Oscar Melton was there 4/14 for an EEG. He is looking for the results."   Please call.

## 2019-09-16 ENCOUNTER — Other Ambulatory Visit: Payer: Self-pay

## 2019-09-16 ENCOUNTER — Ambulatory Visit (INDEPENDENT_AMBULATORY_CARE_PROVIDER_SITE_OTHER): Payer: PPO | Admitting: Neurology

## 2019-09-16 ENCOUNTER — Other Ambulatory Visit: Payer: PPO

## 2019-09-16 DIAGNOSIS — R55 Syncope and collapse: Secondary | ICD-10-CM

## 2019-09-16 DIAGNOSIS — R404 Transient alteration of awareness: Secondary | ICD-10-CM | POA: Diagnosis not present

## 2019-09-17 ENCOUNTER — Telehealth: Payer: Self-pay | Admitting: Neurology

## 2019-09-17 NOTE — Telephone Encounter (Signed)
Patient called requesting to speak with Manuela Schwartz about the camera that was hooked up. Stated it was aggravating to wear. Please call.

## 2019-10-02 ENCOUNTER — Telehealth: Payer: Self-pay | Admitting: Neurology

## 2019-10-02 DIAGNOSIS — D519 Vitamin B12 deficiency anemia, unspecified: Secondary | ICD-10-CM | POA: Diagnosis not present

## 2019-10-02 NOTE — Telephone Encounter (Signed)
I do not see them, please let them know . We will call as soon as the results are resulted from provider. Thank you

## 2019-10-02 NOTE — Telephone Encounter (Signed)
Patient is expecting to hear from a nurse. Please call.

## 2019-10-02 NOTE — Telephone Encounter (Signed)
Patient called to ask if his EEG results have come in yet. Please call.

## 2019-10-02 NOTE — Telephone Encounter (Signed)
Left message on voicemail.

## 2019-10-04 ENCOUNTER — Telehealth: Payer: Self-pay

## 2019-10-04 NOTE — Telephone Encounter (Signed)
I contacted patient regarding EEG, Per Dr. Delice Lesch there were some techinical issues so the company is looking into this. Patient advised.

## 2019-10-08 NOTE — Telephone Encounter (Signed)
Discussed ambulatory EEG which did not show any epileptiform discharges. Typical episodes of flashing lights with confusion were not captured. He had several dizzy episodes with no EEG/EKG changes, but he reports dizziness was mild during the EEG. Once it was taken off, the intense dizziness/vertigo came back and he had a fall. He has dizzy spells daily, yesterday he had several where he had to sit down. It can happen when getting out of bed or moving around. No passing out since 07/2019. The headaches are not as bad. Discussed that at this point I would hold off on seizure medication, monitor symptoms, if he has another episode of loss of consciousness, would start med at that point. The dizzy episodes suggestive of positional vertigo, discussed vestibular therapy, he is agreeable.  Heather, pls send referral for Vestibular therapy for Vertigo to [facility] in [place], thanks!

## 2019-10-08 NOTE — Procedures (Signed)
ELECTROENCEPHALOGRAM REPORT  Dates of Recording: 09/16/2019 9:34AM to 09/19/2019 6:37AM Patient's Name: Oscar Melton MRN: [id number] Date of Birth: Dec 11, 1950  Referring Provider: Dr. Ellouise Newer  Procedure: 61-hour ambulatory video EEG  History: This is a 69 year old man with an episode of loss of consciousness last 07/20/19. He has been having recurrent episodes since 2005 of seeing flashing lights with associated confusion. EEG for classification  Medications:  VITAMIN C 100 MG tablet B-12 COMPLIANCE INJECTION IJ ACID CONTROLLER MAX ST 20 MG tablet MULTIVITAMIN WITH MINERALS TABS EYE HEALTH PO TYLENOL SINUS 30-500 MG TABS QC TUMERIC COMPLEX 500 MG CAPS Zinc 50 MG CAPS  Technical Summary: This is a 61-hour multichannel digital video EEG recording measured by the international 10-20 system with electrodes applied with paste and impedances below 5000 ohms performed as portable with EKG monitoring.  The digital EEG was referentially recorded, reformatted, and digitally filtered in a variety of bipolar and referential montages for optimal display.    DESCRIPTION OF RECORDING: During maximal wakefulness, the background activity consisted of a symmetric medium to high voltage 11 Hz posterior dominant rhythm which was reactive to eye opening.  There were no epileptiform discharges or focal slowing seen in wakefulness.  During the recording, the patient progresses through wakefulness, drowsiness, and Stage 2 sleep. At times, sharply contoured wave forms are seen over the left temporal region with no clear epileptogenic potential. There were no clear epileptiform discharges seen.   Events: On 5/3 at 1423 hours, he got a little dizzy headed. Electrographically, there were no EEG or EKG changes seen.  On 5/4 at 1314 hours, he had a couple of little dizzy spells. Electrographically, there were no EEG or EKG changes seen.  On 5/4 at 1932 hours, he felt a little dizziness. Electrographically,  there were no EEG or EKG changes seen.  On 5/5 at 0515 hours, he has a headache. Electrographically, there were no EEG or EKG changes seen.  On 5/5 at 1815 hours, he feels a little dizzy headed and headache all day. Electrographically, there were no EEG or EKG changes seen.  On 5/5 at 2040 and 1125 hours, he feels dizzyheaded. Electrographically, there were no EEG or EKG changes seen.  There were no electrographic seizures seen.  EKG lead showed occasional extrasystolic beats.  IMPRESSION: This 61-hour ambulatory video EEG study is within normal limits.   CLINICAL CORRELATION: A normal EEG does not exclude a clinical diagnosis of epilepsy. Typical episodes of flashing lights and confusion were not captured. Dizziness and headaches did not show any EEG/EKG correlate. If further clinical questions remain, inpatient video EEG monitoring may be helpful.   Ellouise Newer, M.D.

## 2019-10-09 ENCOUNTER — Other Ambulatory Visit: Payer: Self-pay

## 2019-10-09 DIAGNOSIS — R42 Dizziness and giddiness: Secondary | ICD-10-CM

## 2019-10-09 NOTE — Telephone Encounter (Signed)
Referral placed in epic and faxed to [facility] in [place]

## 2019-10-18 DIAGNOSIS — R42 Dizziness and giddiness: Secondary | ICD-10-CM | POA: Diagnosis not present

## 2019-10-22 DIAGNOSIS — R42 Dizziness and giddiness: Secondary | ICD-10-CM | POA: Diagnosis not present

## 2019-10-24 DIAGNOSIS — Z87898 Personal history of other specified conditions: Secondary | ICD-10-CM | POA: Diagnosis not present

## 2019-10-24 DIAGNOSIS — H8110 Benign paroxysmal vertigo, unspecified ear: Secondary | ICD-10-CM | POA: Diagnosis not present

## 2019-10-24 DIAGNOSIS — Z6826 Body mass index (BMI) 26.0-26.9, adult: Secondary | ICD-10-CM | POA: Diagnosis not present

## 2019-10-24 DIAGNOSIS — R600 Localized edema: Secondary | ICD-10-CM | POA: Diagnosis not present

## 2019-10-25 DIAGNOSIS — R600 Localized edema: Secondary | ICD-10-CM | POA: Diagnosis not present

## 2019-10-25 DIAGNOSIS — M79661 Pain in right lower leg: Secondary | ICD-10-CM | POA: Diagnosis not present

## 2019-10-30 DIAGNOSIS — D510 Vitamin B12 deficiency anemia due to intrinsic factor deficiency: Secondary | ICD-10-CM | POA: Diagnosis not present

## 2019-12-04 DIAGNOSIS — E538 Deficiency of other specified B group vitamins: Secondary | ICD-10-CM | POA: Diagnosis not present

## 2019-12-11 DIAGNOSIS — L820 Inflamed seborrheic keratosis: Secondary | ICD-10-CM | POA: Diagnosis not present

## 2020-01-06 ENCOUNTER — Encounter: Payer: Self-pay | Admitting: Neurology

## 2020-01-06 ENCOUNTER — Ambulatory Visit: Payer: PPO | Admitting: Neurology

## 2020-01-06 ENCOUNTER — Other Ambulatory Visit: Payer: Self-pay

## 2020-01-06 VITALS — BP 161/86 | HR 60 | Ht 66.0 in | Wt 176.0 lb

## 2020-01-06 DIAGNOSIS — R404 Transient alteration of awareness: Secondary | ICD-10-CM

## 2020-01-06 DIAGNOSIS — R42 Dizziness and giddiness: Secondary | ICD-10-CM | POA: Diagnosis not present

## 2020-01-06 DIAGNOSIS — R55 Syncope and collapse: Secondary | ICD-10-CM

## 2020-01-06 NOTE — Patient Instructions (Addendum)
1. Refer to Cardiology in [facility] for syncope  2. Continue to monitor symptoms, hopefully no further passing out. If heart evaluation turns out fine and another passing out spell happens, we can talk about starting medication  3. As per [place] driving laws, no driving after an episode of loss of consciousness until 6 months event-free. It is prudent to recommend that all persons should be free of syncopal episodes for at least six months to be granted the driving privilege." ([employer], Second Edition, Medical Review Branch, Engineer, site, Division of [employer] of Transportation, July 2004)   4. Follow-up in 6 months, call for any changes

## 2020-01-06 NOTE — Progress Notes (Signed)
NEUROLOGY FOLLOW UP OFFICE NOTE  Oscar Melton [id number] [birth year]  HISTORY OF PRESENT ILLNESS: I had the pleasure of seeing Oscar Melton in follow-up in the neurology clinic on 01/06/2020.  The patient was last seen on 4 months ago after an episode of loss of consciousness last 07/20/2019. Records and images were personally reviewed where available.  I personally reviewed MRI brain with and without contrast done 08/2019 which did not show any acute changes. There was mild chronic microvascular disease, vertebrobasilar dolichoectasia. His 1-hour wake and sleep EEG showed left temporal sharp waves, however his 61-hour ambulatory EEG showed sharply contoured left temporal wave forms that were not clearly epileptogenic, within normal limits. He had several episodes of dizziness with no EEG or EKG changes seen. Baseline 1-lead EKG showed occasional extrasystolic beats. He denies any further syncopal episodes since March 2021. He was previously reporting dizziness/spinning in bed, this is not happening any longer. He did vestibular therapy and was told her did not have vertigo on their exam. He continues to report dizzy spells every now and then where he feels lightheaded, sometimes with a little sensation of movement. He was at [employer] recently and felt lightheaded while walking around with the bright lights. Sometimes he gets up and feels dizzy for a second. He has not had the episodes of seeing flashing lights. No confusion. Sometimes he is forgetful. He denies any staring/unresponsive episodes, gaps in time, olfactory/gustatory hallucinations, focal numbness/tingling/weakness, myoclonic jerks. He does not sleep, this is chronic. He had a bad fall after his EEG in May, no loss of consciousness, none since then.    History on Initial Assessment 08/20/2019: This is a pleasant 69 year old right-handed man with a history of hyperlipidemia, in his usual state of health until 07/20/2019 when he had an episode of  loss of consciousness at work. He recalls sitting at the break room and eating a couple of cupcakes, getting up, then has no recollection of events. He denies any prior warning symptoms, however in the ER reported that he began to feel "swimmy headed" and felt like he needed to sit down. When he feels this sensation, it feels like shots of light happen in his head. His coworkers say he was walking in the hallway holding his shoes in his hands and not making sense. He was apparently trying to make a call in the ambulance but does not recall talking to family. Coworkers reported he had vomited. He had a laceration in the posterior scalp requiring staples. Bloodwork showed a WBC of 15.3, K 3.3, UA and UDS negative. I personally reviewed head CT which did not show any acute intracranial abnormalities, there was right occipital scalp swelling and hematoma. 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   He reports the flashes in his head have been ongoing since 2005. He suddenly has flashes in his eyes/head, like strobe lights. He feels like he cannot focus and cannot figure things out. He cannot see well when they happen. There are no associated headache, focal symptoms. He mostly notices them when he goes in a store in [place] around once a month and has to sit down. He states they don't occur at home. He reports a lot of anxiety, he recalls when he was in the 6th grade in 1967, he went to bed and got up "nervous as a cat ever since." He is shaky at different times, sometimes affecting his handwriting. No tremors today. He lives alone and denies being  told of any staring/unresponsive episodes. He has had dizzy spells since the fall, with a spinning sensation. When he lays down his head feels like it is running around inside. He denies any neck/back pain, bowel/bladder dysfunction. Sleep is fairly good. Memory is not as good. He had a normal birth and early development.  There is no  history of febrile convulsions, CNS infections such as meningitis/encephalitis, significant traumatic brain injury, neurosurgical procedures, or family history of seizures.   PAST MEDICAL HISTORY: Past Medical History:  Diagnosis Date  . Borderline high cholesterol   . Glaucoma     MEDICATIONS: Current Outpatient Medications on File Prior to Visit  Medication Sig Dispense Refill  . Cyanocobalamin (B-12 COMPLIANCE INJECTION IJ) Inject as directed every 30 (thirty) days.    . famotidine (ACID CONTROLLER MAX ST) 20 MG tablet Take 20 mg by mouth daily.    . Multiple Vitamin (MULTIVITAMIN WITH MINERALS) TABS tablet Take 1 tablet by mouth daily.    . Multiple Vitamins-Minerals (EYE HEALTH PO) Take by mouth.    . rosuvastatin (CRESTOR) 5 MG tablet Take 5 mg by mouth at bedtime.     . Turmeric (QC TUMERIC COMPLEX) 500 MG CAPS Take by mouth.    . Zinc 50 MG CAPS Take 50 mg by mouth.    . Ascorbic Acid (VITAMIN C) 100 MG tablet Take 500 mg by mouth daily. (Patient not taking: Reported on 01/06/2020)    . pseudoephedrine-acetaminophen (TYLENOL SINUS) 30-500 MG TABS tablet Take 1 tablet by mouth every 4 (four) hours as needed (for congestion). (Patient not taking: Reported on 01/06/2020)     No current facility-administered medications on file prior to visit.    ALLERGIES: No Known Allergies  FAMILY HISTORY: Family History  Problem Relation Age of Onset  . Cancer - Colon Mother   . AAA (abdominal aortic aneurysm) Father   . Stroke Sister   . Heart attack Sister   . Suicidality Brother   . Stroke Maternal Grandmother     SOCIAL HISTORY: Social History   Socioeconomic History  . Marital status: Single    Spouse name: Not on file  . Number of children: Not on file  . Years of education: Not on file  . Highest education level: Not on file  Occupational History  . Not on file  Tobacco Use  . Smoking status: Former Research scientist (life sciences)  . Smokeless tobacco: Never Used  Vaping Use  . Vaping Use:  Never used  Substance and Sexual Activity  . Alcohol use: Never  . Drug use: Not on file  . Sexual activity: Never  Other Topics Concern  . Not on file  Social History Narrative   Right handed    Lives alone in apartment    Social Determinants of Health   Financial Resource Strain:   . Difficulty of Paying Living Expenses: Not on file  Food Insecurity:   . Worried About Charity fundraiser in the Last Year: Not on file  . Ran Out of Food in the Last Year: Not on file  Transportation Needs:   . Lack of Transportation (Medical): Not on file  . Lack of Transportation (Non-Medical): Not on file  Physical Activity:   . Days of Exercise per Week: Not on file  . Minutes of Exercise per Session: Not on file  Stress:   . Feeling of Stress : Not on file  Social Connections:   . Frequency of Communication with Friends and Family: Not on file  .  Frequency of Social Gatherings with Friends and Family: Not on file  . Attends Religious Services: Not on file  . Active Member of Clubs or Organizations: Not on file  . Attends Archivist Meetings: Not on file  . Marital Status: Not on file  Intimate Partner Violence:   . Fear of Current or Ex-Partner: Not on file  . Emotionally Abused: Not on file  . Physically Abused: Not on file  . Sexually Abused: Not on file    PHYSICAL EXAM: Vitals:   01/06/20 1626  BP: (!) 161/86  Pulse: 60  SpO2: 97%   General: No acute distress Head:  Normocephalic/atraumatic Skin/Extremities: No rash, no edema Neurological Exam: alert and oriented to person, place, and time. No aphasia or dysarthria. Fund of knowledge is appropriate.  Recent and remote memory are intact.  Attention and concentration are normal. Cranial nerves: Pupils equal, round. Extraocular movements intact with no nystagmus. Visual fields full.  No facial asymmetry. Motor: Bulk and tone normal, muscle strength 5/5 throughout with no pronator drift.  Finger to nose testing intact.   Gait narrow-based and steady, difficulty with tandem walk.  Romberg negative. + bilateral endpoint and postural tremor.   IMPRESSION: This is a pleasant 69 yo RH man with a history of hyperlipidemia with a syncopal episode last 07/20/19. MRI brain unremarkable. His initial EEG showed left temporal sharp waves, however prolonged EEG done after did not show any clear epileptiform activity. He has not had any further syncopal episodes since March 2021. He continues to report brief episodes of lightheadedness, some positional. Proceed with Cardiology referral for syncope and dizziness. We discussed that if cardiac evaluation is unremarkable and he has another episode of loss of consciousness, we can discuss a therapeutic trial with antiepileptic medication. [place] driving laws were again discussed, he knows to stop driving after an episode of loss of consciousness until 6 months event-free. Follow-up in 6 months, he knows to call for any changes.    Thank you for allowing me to participate in his care.  Please do not hesitate to call for any questions or concerns.   Ellouise Newer, M.D.   CC: Dr. Lin Landsman

## 2020-01-08 DIAGNOSIS — E538 Deficiency of other specified B group vitamins: Secondary | ICD-10-CM | POA: Diagnosis not present

## 2020-01-14 ENCOUNTER — Ambulatory Visit (INDEPENDENT_AMBULATORY_CARE_PROVIDER_SITE_OTHER): Payer: PPO

## 2020-01-14 ENCOUNTER — Encounter: Payer: Self-pay | Admitting: Cardiology

## 2020-01-14 ENCOUNTER — Ambulatory Visit (INDEPENDENT_AMBULATORY_CARE_PROVIDER_SITE_OTHER): Payer: PPO | Admitting: Cardiology

## 2020-01-14 ENCOUNTER — Other Ambulatory Visit: Payer: Self-pay

## 2020-01-14 VITALS — BP 144/88 | HR 67 | Ht 66.0 in | Wt 177.0 lb

## 2020-01-14 DIAGNOSIS — R072 Precordial pain: Secondary | ICD-10-CM

## 2020-01-14 DIAGNOSIS — R030 Elevated blood-pressure reading, without diagnosis of hypertension: Secondary | ICD-10-CM | POA: Insufficient documentation

## 2020-01-14 DIAGNOSIS — R55 Syncope and collapse: Secondary | ICD-10-CM | POA: Diagnosis not present

## 2020-01-14 DIAGNOSIS — R42 Dizziness and giddiness: Secondary | ICD-10-CM

## 2020-01-14 DIAGNOSIS — E782 Mixed hyperlipidemia: Secondary | ICD-10-CM

## 2020-01-14 DIAGNOSIS — R079 Chest pain, unspecified: Secondary | ICD-10-CM | POA: Insufficient documentation

## 2020-01-14 MED ORDER — METOPROLOL TARTRATE 100 MG PO TABS: 100.0000 mg | ORAL_TABLET | Freq: Once | ORAL | 0 refills | Status: DC

## 2020-01-14 MED ORDER — NITROGLYCERIN 0.4 MG SL SUBL
0.4000 mg | SUBLINGUAL_TABLET | SUBLINGUAL | 3 refills | Status: AC | PRN
Start: 2020-01-14 — End: 2023-10-18

## 2020-01-14 NOTE — Progress Notes (Signed)
Cardiology Office Note:    Date:  01/14/2020   ID:  Oscar Melton, DOB 07-28-1950, MRN [id number]  PCP:  Angelina Sheriff, MD  Cardiologist:  No primary care provider on file.  Electrophysiologist:  None   Referring MD: Angelina Sheriff, MD   :" I had a syncope episode in march 2021"  History of Present Illness:    Oscar Melton is a 69 y.o. male with a hx of hyperlipidemia presents today to be evaluated for syncope episode that happened back in March 2021 as well as a left-sided chest pain.  Patient tells me that over the last several months he has had very rare intermittent left-sided chest pain.  He notices a pressure-like sensation he does not really it last for few minutes and then resolves.  In addition he is went under extensive work-up recently due to dizziness and a syncope episode.  He tells me that this syncope episode happened on July 20, 2019 while he was at work.  He was in his normal state of health at work when he was sitting in the bed normal he remembers he was eating some cupcakes and then felt no warning signs and instantly passed out.  He cannot remember the incident.  All he remembers was waking up with everybody trying to help him sit down.    He tells me that since that time he has not had any recurrent symptoms.  But he has had significant work-up with neurology he had an EEG did not show any epileptic activity.  After his extensive neuro work-up it was recommended the patient see cardiology.   Past Medical History:  Diagnosis Date  . Borderline high cholesterol   . Glaucoma     Past Surgical History:  Procedure Laterality Date  . CATARACT EXTRACTION, BILATERAL      Current Medications: Current Meds  Medication Sig  . Cyanocobalamin (B-12 COMPLIANCE INJECTION IJ) Inject as directed every 30 (thirty) days.  . famotidine (ACID CONTROLLER MAX ST) 20 MG tablet Take 20 mg by mouth daily.  Marland Kitchen loratadine (CLARITIN) 10 MG tablet Take 10 mg by mouth daily.    . rosuvastatin (CRESTOR) 5 MG tablet Take 5 mg by mouth at bedtime.   . Zinc 50 MG CAPS Take 50 mg by mouth.     Allergies:   Patient has no known allergies.   Social History   Socioeconomic History  . Marital status: Single    Spouse name: Not on file  . Number of children: Not on file  . Years of education: Not on file  . Highest education level: Not on file  Occupational History  . Not on file  Tobacco Use  . Smoking status: Former Research scientist (life sciences)  . Smokeless tobacco: Never Used  Vaping Use  . Vaping Use: Never used  Substance and Sexual Activity  . Alcohol use: Never  . Drug use: Not on file  . Sexual activity: Never  Other Topics Concern  . Not on file  Social History Narrative   Right handed    Lives alone in apartment    Social Determinants of Health   Financial Resource Strain:   . Difficulty of Paying Living Expenses: Not on file  Food Insecurity:   . Worried About Charity fundraiser in the Last Year: Not on file  . Ran Out of Food in the Last Year: Not on file  Transportation Needs:   . Lack of Transportation (Medical): Not on file  .  Lack of Transportation (Non-Medical): Not on file  Physical Activity:   . Days of Exercise per Week: Not on file  . Minutes of Exercise per Session: Not on file  Stress:   . Feeling of Stress : Not on file  Social Connections:   . Frequency of Communication with Friends and Family: Not on file  . Frequency of Social Gatherings with Friends and Family: Not on file  . Attends Religious Services: Not on file  . Active Member of Clubs or Organizations: Not on file  . Attends Archivist Meetings: Not on file  . Marital Status: Not on file     Family History: The patient's family history includes AAA (abdominal aortic aneurysm) in his father; Cancer - Colon in his mother; Heart attack in his sister; Stroke in his maternal grandmother and sister; Suicidality in his brother.  ROS:   Review of Systems  Constitution:  Reports intermittent dizziness.  Negative for decreased appetite, fever and weight gain.  HENT: Negative for congestion, ear discharge, hoarse voice and sore throat.   Eyes: Negative for discharge, redness, vision loss in right eye and visual halos.  Cardiovascular: Reports syncope episode in March 2021.  Negative for chest pain, dyspnea on exertion, leg swelling, orthopnea and palpitations.  Respiratory: Negative for cough, hemoptysis, shortness of breath and snoring.   Endocrine: Negative for heat intolerance and polyphagia.  Hematologic/Lymphatic: Negative for bleeding problem. Does not bruise/bleed easily.  Skin: Negative for flushing, nail changes, rash and suspicious lesions.  Musculoskeletal: Negative for arthritis, joint pain, muscle cramps, myalgias, neck pain and stiffness.  Gastrointestinal: Negative for abdominal pain, bowel incontinence, diarrhea and excessive appetite.  Genitourinary: Negative for decreased libido, genital sores and incomplete emptying.  Neurological: Negative for brief paralysis, focal weakness, headaches and loss of balance.  Psychiatric/Behavioral: Negative for altered mental status, depression and suicidal ideas.  Allergic/Immunologic: Negative for HIV exposure and persistent infections.    EKGs/Labs/Other Studies Reviewed:    The following studies were reviewed today:   EKG:  The ekg ordered today demonstrates sinus rhythm, heart rate 67 bpm with evidence of first-degree AV block.  No prior EKG for comparison.  MRI brain without contrast IMPRESSION: 1. No specific explanation for symptoms and no reversible finding. 2. Vertebrobasilar dolichoectasia. 3. Mild chronic white matter disease.  Recent Labs: 07/20/2019: ALT 17; BUN 9; Creatinine, Ser 1.09; Hemoglobin 15.4; Platelets 177; Potassium 3.3; Sodium 140  Recent Lipid Panel No results found for: CHOL, TRIG, HDL, CHOLHDL, VLDL, LDLCALC, LDLDIRECT  Physical Exam:    VS:  BP (!) 144/88   Pulse 67    Ht $R'5\' 6"'lG$  (1.676 m)   Wt 176 lb 15.7 oz (80.3 kg)   SpO2 95%   BMI 28.57 kg/m     Wt Readings from Last 3 Encounters:  01/14/20 176 lb 15.7 oz (80.3 kg)  01/06/20 176 lb (79.8 kg)  08/20/19 172 lb 6.4 oz (78.2 kg)     GEN: Well nourished, well developed in no acute distress HEENT: Normal NECK: No JVD; No carotid bruits LYMPHATICS: No lymphadenopathy CARDIAC: S1S2 noted,RRR, no murmurs, rubs, gallops RESPIRATORY:  Clear to auscultation without rales, wheezing or rhonchi  ABDOMEN: Soft, non-tender, non-distended, +bowel sounds, no guarding. EXTREMITIES: No edema, No cyanosis, no clubbing MUSCULOSKELETAL:  No deformity  SKIN: Warm and dry NEUROLOGIC:  Alert and oriented x 3, non-focal PSYCHIATRIC:  Normal affect, good insight  ASSESSMENT:    1. Syncope and collapse   2. Precordial chest pain  3. Dizziness   4. Elevated blood pressure reading   5. Mixed hyperlipidemia    PLAN:    I would like to rule out a cardiovascular etiology of this syncope, therefore at this time I would like to placed a zio patch for   14 days. In additon a transthoracic echocardiogram will be ordered to assess LV/RV function and any structural abnormalities. Once these testing have been performed amd reviewed further reccomendations will be made. For now, I do reccomend that the patient goes to the nearest ED if  symptoms recur.  In addition his intermittent chest pain is concerning given his risk factors I am going to go ahead and pursue a coronary CTA in this patient.  I have educated patient about this testing.  He is agreeable to proceed with this testing.  He has no IV contrast dye allergies.  In addition, sublingual nitroglycerin prescription was sent, its protocol and 911 protocol explained and the patient vocalized understanding questions were answered to the patient's satisfaction.  His blood pressure slightly elevated today.  I have educated patient on decreasing salt intake in his diet.  Given  his dizziness and syncope episode I will hold off on adding any additional antihypertensive medication at this time  Hyperlipidemia continue patient on his Crestor 5 mg daily.  The patient is in agreement with the above plan. The patient left the office in stable condition.  The patient will follow up in 3 months or sooner if needed.   Medication Adjustments/Labs and Tests Ordered: Current medicines are reviewed at length with the patient today.  Concerns regarding medicines are outlined above.  Orders Placed This Encounter  Procedures  . CT CORONARY MORPH W/CTA COR W/SCORE W/CA W/CM &/OR WO/CM  . CT CORONARY FRACTIONAL FLOW RESERVE DATA PREP  . CT CORONARY FRACTIONAL FLOW RESERVE FLUID ANALYSIS  . Basic Metabolic Panel (BMET)  . LONG TERM MONITOR (3-14 DAYS)  . EKG 12-Lead  . ECHOCARDIOGRAM COMPLETE   Meds ordered this encounter  Medications  . nitroGLYCERIN (NITROSTAT) 0.4 MG SL tablet    Sig: Place 1 tablet (0.4 mg total) under the tongue every 5 (five) minutes as needed for chest pain.    Dispense:  25 tablet    Refill:  3  . metoprolol tartrate (LOPRESSOR) 100 MG tablet    Sig: Take 1 tablet (100 mg total) by mouth once for 1 dose. 2 hours prior to procedure    Dispense:  1 tablet    Refill:  0    Patient Instructions  Medication Instructions:    START TAKING NITROGLYCERIN 0.4 SUBLINGUAL AS NEEDED FOR CHEST PAIN  *If you need a refill on your cardiac medications before your next appointment, please call your pharmacy*   Lab Work:  RETURN 3-4 DAYS BEFORE CT SCAN TO GET BLOOD WORK   If you have labs (blood work) drawn today and your tests are completely normal, you will receive your results only by: Marland Kitchen MyChart Message (if you have MyChart) OR . A paper copy in the mail If you have any lab test that is abnormal or we need to change your treatment, we will call you to review the results.   Testing/Procedures: Your physician has requested that you have an  echocardiogram. Echocardiography is a painless test that uses sound waves to create images of your heart. It provides your doctor with information about the size and shape of your heart and how well your heart's chambers and valves are working. This procedure  takes approximately one hour. There are no restrictions for this procedure.  ( WEAR FOR 14 DAYS ONLY AND MAIL AND RETURN)  Your physician has recommended that you wear an event monitor. Event monitors are medical devices that record the heart's electrical activity. Doctors most often [place] these monitors to diagnose arrhythmias. Arrhythmias are problems with the speed or rhythm of the heartbeat. The monitor is a small, portable device. You can wear one while you do your normal daily activities. This is usually used to diagnose what is causing palpitations/syncope (passing out).  Non-Cardiac CT Angiography (CTA), is a special type of CT scan that uses a computer to produce multi-dimensional views of major blood vessels throughout the body. In CT angiography, a contrast material is injected through an IV to help visualize the blood vessels   Follow-Up: At [facility], you and your health needs are our priority.  As part of our continuing mission to provide you with exceptional heart care, we have created designated Provider Care Teams.  These Care Teams include your primary Cardiologist (physician) and Advanced Practice Providers (APPs -  Physician Assistants and Nurse Practitioners) who all work together to provide you with the care you need, when you need it.  We recommend signing up for the patient portal called "MyChart".  Sign up information is provided on this After Visit Summary.  MyChart is used to connect with patients for Virtual Visits (Telemedicine).  Patients are able to view lab/test results, encounter notes, upcoming appointments, etc.  Non-urgent messages can be sent to your provider as well.   To learn more about what you can do with  MyChart, go to [email or web address].    Your next appointment:   3 month(s)  The format for your next appointment:   In Person  Provider:   You will see Dr.Amyriah Buras Or, you can be scheduled with the following Advanced Practice Provider on your designated Care Team (at our [facility]):  Laurann Montana, FNP     Other Instructions  Your cardiac CT will be scheduled at one of the below locations:   [facility] [address] [place], [place] [address] [phone]  [facility] [address] [place], [place] [address] [phone]  If scheduled at [facility], please arrive at the [facility] main entrance of [facility] 30 minutes prior to test start time. Proceed to the [facility] Radiology Department (first floor) to check-in and test prep.  If scheduled at [facility], please arrive 15 mins early for check-in and test prep.  Please follow these instructions carefully (unless otherwise directed):  Hold all erectile dysfunction medications at least 3 days (72 hrs) prior to test.  On the Night Before the Test: . Be sure to Drink plenty of water. . Do not consume any caffeinated/decaffeinated beverages or chocolate 12 hours prior to your test. . Do not take any antihistamines 12 hours prior to your test.  On the Day of the Test: . Drink plenty of water. Do not drink any water within one hour of the test. . Do not eat any food 4 hours prior to the test. . You may take your regular medications prior to the test.  . Take metoprolol (Lopressor) two hours prior to test. . HOLD Furosemide/Hydrochlorothiazide morning of the test.    *For Clinical Staff only. Please instruct patient the following:*        -Drink plenty of water       -  Hold Furosemide/hydrochlorothiazide morning of the test       -Take metoprolol (Lopressor) 2 hours prior to test (if applicable).                   -If HR is less than 55 BPM- No Lopressor                -IF HR is greater than 55 BPM and patient is less than or equal to 61 yrs old Lopressor $RemoveBefor'100mg'gFjOvbHrLvZO$  x1.                -If HR is greater than 55 BPM and patient is greater than 44 yrs old Lopressor 50 mg x1.     Do not give Lopressor to patients with an allergy to lopressor or anyone with asthma or active COPD symptoms (currently taking steroids).       After the Test: . Drink plenty of water. . After receiving IV contrast, you may experience a mild flushed feeling. This is normal. . On occasion, you may experience a mild rash up to 24 hours after the test. This is not dangerous. If this occurs, you can take Benadryl 25 mg and increase your fluid intake. . If you experience trouble breathing, this can be serious. If it is severe call 911 IMMEDIATELY. If it is mild, please call our office. . If you take any of these medications: Glipizide/Metformin, Avandament, Glucavance, please do not take 48 hours after completing test unless otherwise instructed.   Once we have confirmed authorization from your insurance company, we will call you to set up a date and time for your test. Based on how quickly your insurance processes prior authorizations requests, please allow up to 4 weeks to be contacted for scheduling your Cardiac CT appointment. Be advised that routine Cardiac CT appointments could be scheduled as many as 8 weeks after your provider has ordered it.  For non-scheduling related questions, please contact the cardiac imaging nurse navigator should you have any questions/concerns: Marchia Bond, Cardiac Imaging Nurse Navigator Burley Saver, Interim Cardiac Imaging Nurse [place] and Vascular Services Direct Office Dial: [phone]   For scheduling needs, including cancellations and rescheduling, please call Vivien Rota at [phone], option 3.       Adopting a Healthy Lifestyle.  Know what a healthy weight  is for you (roughly BMI <25) and aim to maintain this   Aim for 7+ servings of fruits and vegetables daily   65-80+ fluid ounces of water or unsweet tea for healthy kidneys   Limit to max 1 drink of alcohol per day; avoid smoking/tobacco   Limit animal fats in diet for cholesterol and heart health - choose grass fed whenever available   Avoid highly processed foods, and foods high in saturated/trans fats   Aim for low stress - take time to unwind and care for your mental health   Aim for 150 min of moderate intensity exercise weekly for heart health, and weights twice weekly for bone health   Aim for 7-9 hours of sleep daily   When it comes to diets, agreement about the perfect plan isnt easy to find, even among the experts. Experts at the [employer] developed an idea known as the Healthy Eating Plate. Just imagine a plate divided into logical, healthy portions.   The emphasis is on diet quality:   Load up on vegetables and fruits - one-half of your plate: Aim for color and variety, and remember that  potatoes dont count.   Go for whole grains - one-quarter of your plate: Whole wheat, barley, wheat berries, quinoa, oats, brown rice, and foods made with them. If you want pasta, go with whole wheat pasta.   Protein power - one-quarter of your plate: Fish, chicken, beans, and nuts are all healthy, versatile protein sources. Limit red meat.   The diet, however, does go beyond the plate, offering a few other suggestions.   Use healthy plant oils, such as olive, canola, soy, corn, sunflower and peanut. Check the labels, and avoid partially hydrogenated oil, which have unhealthy trans fats.   If youre thirsty, drink water. Coffee and tea are good in moderation, but skip sugary drinks and limit milk and dairy products to one or two daily servings.   The type of carbohydrate in the diet is more important than the amount. Some sources of carbohydrates, such as  vegetables, fruits, whole grains, and beans-are healthier than others.   Finally, stay active  Signed, Berniece Salines, DO  01/14/2020 4:41 PM    [facility]

## 2020-01-14 NOTE — Patient Instructions (Addendum)
Medication Instructions:    START TAKING NITROGLYCERIN 0.4 SUBLINGUAL AS NEEDED FOR CHEST PAIN  *If you need a refill on your cardiac medications before your next appointment, please call your pharmacy*   Lab Work:  RETURN 3-4 DAYS BEFORE CT SCAN TO GET BLOOD WORK   If you have labs (blood work) drawn today and your tests are completely normal, you will receive your results only by: Marland Kitchen MyChart Message (if you have MyChart) OR . A paper copy in the mail If you have any lab test that is abnormal or we need to change your treatment, we will call you to review the results.   Testing/Procedures: Your physician has requested that you have an echocardiogram. Echocardiography is a painless test that uses sound waves to create images of your heart. It provides your doctor with information about the size and shape of your heart and how well your heart's chambers and valves are working. This procedure takes approximately one hour. There are no restrictions for this procedure.  ( WEAR FOR 14 DAYS ONLY AND MAIL AND RETURN)  Your physician has recommended that you wear an event monitor. Event monitors are medical devices that record the heart's electrical activity. Doctors most often [place] these monitors to diagnose arrhythmias. Arrhythmias are problems with the speed or rhythm of the heartbeat. The monitor is a small, portable device. You can wear one while you do your normal daily activities. This is usually used to diagnose what is causing palpitations/syncope (passing out).  Non-Cardiac CT Angiography (CTA), is a special type of CT scan that uses a computer to produce multi-dimensional views of major blood vessels throughout the body. In CT angiography, a contrast material is injected through an IV to help visualize the blood vessels   Follow-Up: At [facility], you and your health needs are our priority.  As part of our continuing mission to provide you with exceptional heart care, we have created  designated Provider Care Teams.  These Care Teams include your primary Cardiologist (physician) and Advanced Practice Providers (APPs -  Physician Assistants and Nurse Practitioners) who all work together to provide you with the care you need, when you need it.  We recommend signing up for the patient portal called "MyChart".  Sign up information is provided on this After Visit Summary.  MyChart is used to connect with patients for Virtual Visits (Telemedicine).  Patients are able to view lab/test results, encounter notes, upcoming appointments, etc.  Non-urgent messages can be sent to your provider as well.   To learn more about what you can do with MyChart, go to [email or web address].    Your next appointment:   3 month(s)  The format for your next appointment:   In Person  Provider:   You will see Dr.Tobb Or, you can be scheduled with the following Advanced Practice Provider on your designated Care Team (at our [facility]):  Laurann Montana, FNP     Other Instructions  Your cardiac CT will be scheduled at one of the below locations:   [facility] [address] [place], [place] [address] [phone]  [facility] [address] [place], [place] [address] [phone]  If scheduled at [facility], please arrive at the [facility] main entrance of [facility] 30 minutes prior to test start time. Proceed to the [facility] Radiology Department (first floor) to check-in and test prep.  If scheduled at [facility],  please arrive 15 mins early for check-in and test prep.  Please follow these instructions carefully (unless otherwise directed):  Hold all erectile dysfunction medications at least 3 days (72 hrs) prior to test.  On the Night Before the Test: . Be sure to Drink plenty of water. . Do not consume any caffeinated/decaffeinated beverages or chocolate 12  hours prior to your test. . Do not take any antihistamines 12 hours prior to your test.  On the Day of the Test: . Drink plenty of water. Do not drink any water within one hour of the test. . Do not eat any food 4 hours prior to the test. . You may take your regular medications prior to the test.  . Take metoprolol (Lopressor) two hours prior to test. . HOLD Furosemide/Hydrochlorothiazide morning of the test.    *For Clinical Staff only. Please instruct patient the following:*        -Drink plenty of water       -Hold Furosemide/hydrochlorothiazide morning of the test       -Take metoprolol (Lopressor) 2 hours prior to test (if applicable).                  -If HR is less than 55 BPM- No Lopressor                -IF HR is greater than 55 BPM and patient is less than or equal to 7 yrs old Lopressor $RemoveBefor'100mg'SemXgmAMYfvr$  x1.                -If HR is greater than 55 BPM and patient is greater than 58 yrs old Lopressor 50 mg x1.     Do not give Lopressor to patients with an allergy to lopressor or anyone with asthma or active COPD symptoms (currently taking steroids).       After the Test: . Drink plenty of water. . After receiving IV contrast, you may experience a mild flushed feeling. This is normal. . On occasion, you may experience a mild rash up to 24 hours after the test. This is not dangerous. If this occurs, you can take Benadryl 25 mg and increase your fluid intake. . If you experience trouble breathing, this can be serious. If it is severe call 911 IMMEDIATELY. If it is mild, please call our office. . If you take any of these medications: Glipizide/Metformin, Avandament, Glucavance, please do not take 48 hours after completing test unless otherwise instructed.   Once we have confirmed authorization from your insurance company, we will call you to set up a date and time for your test. Based on how quickly your insurance processes prior authorizations requests, please allow up to 4 weeks to be  contacted for scheduling your Cardiac CT appointment. Be advised that routine Cardiac CT appointments could be scheduled as many as 8 weeks after your provider has ordered it.  For non-scheduling related questions, please contact the cardiac imaging nurse navigator should you have any questions/concerns: Marchia Bond, Cardiac Imaging Nurse Navigator Burley Saver, Interim Cardiac Imaging Nurse [place] and Vascular Services Direct Office Dial: [phone]   For scheduling needs, including cancellations and rescheduling, please call Vivien Rota at [phone], option 3.

## 2020-01-15 ENCOUNTER — Telehealth: Payer: Self-pay | Admitting: Cardiology

## 2020-01-15 NOTE — Telephone Encounter (Signed)
New message:     Patient calling concering some medication. He is not sure what the medication is for

## 2020-01-15 NOTE — Telephone Encounter (Signed)
Spoke to the patient just now and he had a question about his lopressor. I let him know that it is to be taken two hours prior to his cardiac CT. He verbalizes understanding and thanks me for the call back.    Encouraged patient to call back with any questions or concerns.

## 2020-01-29 DIAGNOSIS — R079 Chest pain, unspecified: Secondary | ICD-10-CM | POA: Diagnosis not present

## 2020-01-30 LAB — BASIC METABOLIC PANEL
BUN/Creatinine Ratio: 10 (ref 10–24)
BUN: 11 mg/dL (ref 8–27)
CO2: 20 mmol/L (ref 20–29)
Calcium: 9.1 mg/dL (ref 8.6–10.2)
Chloride: 105 mmol/L (ref 96–106)
Creatinine, Ser: 1.13 mg/dL (ref 0.76–1.27)
GFR calc Af Amer: 77 mL/min/{1.73_m2} (ref >59)
GFR calc non Af Amer: 67 mL/min/{1.73_m2} (ref >59)
Glucose: 103 mg/dL — ABNORMAL HIGH (ref 65–99)
Potassium: 4.2 mmol/L (ref 3.5–5.2)
Sodium: 138 mmol/L (ref 134–144)

## 2020-01-31 ENCOUNTER — Telehealth: Payer: Self-pay

## 2020-01-31 NOTE — Telephone Encounter (Signed)
-----   Message from Berniece Salines, DO sent at 01/30/2020  5:47 PM EDT -----   Stable labs

## 2020-01-31 NOTE — Telephone Encounter (Signed)
Spoke with patient regarding results and recommendation.  Patient verbalizes understanding and is agreeable to plan of care. Advised patient to call back with any issues or concerns.  

## 2020-02-03 ENCOUNTER — Telehealth (HOSPITAL_COMMUNITY): Payer: Self-pay | Admitting: *Deleted

## 2020-02-03 NOTE — Telephone Encounter (Signed)
Reaching out to patient to offer assistance regarding upcoming cardiac imaging study; pt verbalizes understanding of appt date/time, parking situation and where to check in, pre-test NPO status and medications ordered, and verified current allergies; name and call back number provided for further questions should they arise ° °Arshia Spellman Tai RN Navigator Cardiac Imaging °[facility] °[phone] office °[phone] cell ° °

## 2020-02-05 ENCOUNTER — Ambulatory Visit (HOSPITAL_COMMUNITY)
Admission: RE | Admit: 2020-02-05 | Discharge: 2020-02-05 | Disposition: A | Discharge status: Home / Self Care | Payer: PPO | Source: Ambulatory Visit | Attending: Cardiology | Admitting: Cardiology

## 2020-02-05 ENCOUNTER — Other Ambulatory Visit: Payer: Self-pay

## 2020-02-05 DIAGNOSIS — I2510 Atherosclerotic heart disease of native coronary artery without angina pectoris: Secondary | ICD-10-CM | POA: Insufficient documentation

## 2020-02-05 DIAGNOSIS — R072 Precordial pain: Secondary | ICD-10-CM | POA: Diagnosis not present

## 2020-02-05 MED ORDER — NITROGLYCERIN 0.4 MG SL SUBL
SUBLINGUAL_TABLET | SUBLINGUAL | Status: AC
  Filled 2020-02-05: qty 2

## 2020-02-05 MED ORDER — IOHEXOL 350 MG/ML SOLN
80.0000 mL | Freq: Once | INTRAVENOUS | Status: AC | PRN
  Administered 2020-02-05: 80 mL via INTRAVENOUS

## 2020-02-05 MED ORDER — NITROGLYCERIN 0.4 MG SL SUBL
0.8000 mg | SUBLINGUAL_TABLET | Freq: Once | SUBLINGUAL | Status: AC
  Administered 2020-02-05: 0.8 mg via SUBLINGUAL

## 2020-02-05 NOTE — Discharge Instructions (Addendum)
Liver Biopsy, Care After These instructions give you information on caring for yourself after your procedure. Your doctor may also give you more specific instructions. Call your doctor if you have any problems or questions after your procedure. What can I expect after the procedure? After the procedure, it is common to have:  Pain and soreness where the biopsy was done.  Bruising around the area where the biopsy was done.  Sleepiness and be tired for a few days. Follow these instructions at home: Medicines  Take over-the-counter and prescription medicines only as told by your doctor.  If you were prescribed an antibiotic medicine, take it as told by your doctor. Do not stop taking the antibiotic even if you start to feel better.  Do not take medicines such as aspirin and ibuprofen. These medicines can thin your blood. Do not take these medicines unless your doctor tells you to take them.  If you are taking prescription pain medicine, take actions to prevent or treat constipation. Your doctor may recommend that you: ? Drink enough fluid to keep your pee (urine) clear or pale yellow. ? Take over-the-counter or prescription medicines. ? Eat foods that are high in fiber, such as fresh fruits and vegetables, whole grains, and beans. ? Limit foods that are high in fat and processed sugars, such as fried and sweet foods. Caring for your cut  Follow instructions from your doctor about how to take care of your cuts from surgery (incisions). Make sure you: ? Wash your hands with soap and water before you change your bandage (dressing). If you cannot use soap and water, use hand sanitizer. ? Change your bandage as told by your doctor. ? Leave stitches (sutures), skin glue, or skin tape (adhesive) strips in place. They may need to stay in place for 2 weeks or longer. If tape strips get loose and curl up, you may trim the loose edges. Do not remove tape strips completely unless your doctor says it is  okay.  Check your cuts every day for signs of infection. Check for: ? Redness, swelling, or more pain. ? Fluid or blood. ? Pus or a bad smell. ? Warmth.  Do not take baths, swim, or use a hot tub until your doctor says it is okay to do so. Activity   Rest at home for 1-2 days or as told by your doctor. ? Avoid sitting for a long time without moving. Get up to take short walks every 1-2 hours.  Return to your normal activities as told by your doctor. Ask what activities are safe for you.  Do not do these things in the first 24 hours: ? Drive. ? Use machinery. ? Take a bath or shower.  Do not lift more than 10 pounds (4.5 kg) or play contact sports for the first 2 weeks. General instructions   Do not drink alcohol in the first week after the procedure.  Have someone stay with you for at least 24 hours after the procedure.  Get your test results. Ask your doctor or the department that is doing the test: ? When will my results be ready? ? How will I get my results? ? What are my treatment options? ? What other tests do I need? ? What are my next steps?  Keep all follow-up visits as told by your doctor. This is important. Contact a doctor if:  A cut bleeds and leaves more than just a small spot of blood.  A cut is red, puffs up (  swells), or hurts more than before.  Fluid or something else comes from a cut.  A cut smells bad.  You have a fever or chills. Get help right away if:  You have swelling, bloating, or pain in your belly (abdomen).  You get dizzy or faint.  You have a rash.  You feel sick to your stomach (nauseous) or throw up (vomit).  You have trouble breathing, feel short of breath, or feel faint.  Your chest hurts.  You have problems talking or seeing.  You have trouble with your balance or moving your arms or legs. Summary  After the procedure, it is common to have pain, soreness, bruising, and tiredness.  Your doctor will tell you how to  take care of yourself at home. Change your bandage, take your medicines, and limit your activities as told by your doctor.  Call your doctor if you have symptoms of infection. Get help right away if your belly swells, your cut bleeds a lot, or you have trouble talking or breathing. This information is not intended to replace advice given to you by your health care provider. Make sure you discuss any questions you have with your health care provider. Document Revised: 05/12/2017 Document Reviewed: 05/12/2017 Elsevier Patient Education  2020 [employer] Moderate Conscious Sedation, Adult Sedation is the use of medicines to promote relaxation and relieve discomfort and anxiety. Moderate conscious sedation is a type of sedation. Under moderate conscious sedation, you are less alert than normal, but you are still able to respond to instructions, touch, or both. Moderate conscious sedation is used during short medical and dental procedures. It is milder than deep sedation, which is a type of sedation under which you cannot be easily woken up. It is also milder than general anesthesia, which is the use of medicines to make you unconscious. Moderate conscious sedation allows you to return to your regular activities sooner. Tell a health care provider about:  Any allergies you have.  All medicines you are taking, including vitamins, herbs, eye drops, creams, and over-the-counter medicines.  Use of steroids (by mouth or creams).  Any problems you or family members have had with sedatives and anesthetic medicines.  Any blood disorders you have.  Any surgeries you have had.  Any medical conditions you have, such as sleep apnea.  Whether you are pregnant or may be pregnant.  Any use of cigarettes, alcohol, marijuana, or street drugs. What are the risks? Generally, this is a safe procedure. However, problems may occur, including:  Getting too much medicine (oversedation).  Nausea.  Allergic  reaction to medicines.  Trouble breathing. If this happens, a breathing tube may be used to help with breathing. It will be removed when you are awake and breathing on your own.  Heart trouble.  Lung trouble. What happens before the procedure? Staying hydrated Follow instructions from your health care provider about hydration, which may include:  Up to 2 hours before the procedure - you may continue to drink clear liquids, such as water, clear fruit juice, black coffee, and plain tea. Eating and drinking restrictions Follow instructions from your health care provider about eating and drinking, which may include:  8 hours before the procedure - stop eating heavy meals or foods such as meat, fried foods, or fatty foods.  6 hours before the procedure - stop eating light meals or foods, such as toast or cereal.  6 hours before the procedure - stop drinking milk or drinks that contain milk.  2   hours before the procedure - stop drinking clear liquids. Medicine Ask your health care provider about:  Changing or stopping your regular medicines. This is especially important if you are taking diabetes medicines or blood thinners.  Taking medicines such as aspirin and ibuprofen. These medicines can thin your blood. Do not take these medicines before your procedure if your health care provider instructs you not to.  Tests and exams  You will have a physical exam.  You may have blood tests done to show: ? How well your kidneys and liver are working. ? How well your blood can clot. General instructions  Plan to have someone take you home from the hospital or clinic.  If you will be going home right after the procedure, plan to have someone with you for 24 hours. What happens during the procedure?  An IV tube will be inserted into one of your veins.  Medicine to help you relax (sedative) will be given through the IV tube.  The medical or dental procedure will be performed. What  happens after the procedure?  Your blood pressure, heart rate, breathing rate, and blood oxygen level will be monitored often until the medicines you were given have worn off.  Do not drive for 24 hours. This information is not intended to replace advice given to you by your health care provider. Make sure you discuss any questions you have with your health care provider. Document Revised: 04/14/2017 Document Reviewed: 08/22/2015 Elsevier Patient Education  2020 [employer] Inc.  

## 2020-02-05 NOTE — Progress Notes (Signed)
CT scan completed. Tolerated well. D/C home ambulatory, awake and alert. In no distress. 

## 2020-02-07 ENCOUNTER — Ambulatory Visit (INDEPENDENT_AMBULATORY_CARE_PROVIDER_SITE_OTHER): Payer: PPO

## 2020-02-07 ENCOUNTER — Other Ambulatory Visit: Payer: Self-pay

## 2020-02-07 DIAGNOSIS — I2510 Atherosclerotic heart disease of native coronary artery without angina pectoris: Secondary | ICD-10-CM | POA: Diagnosis not present

## 2020-02-07 DIAGNOSIS — R072 Precordial pain: Secondary | ICD-10-CM | POA: Diagnosis not present

## 2020-02-07 DIAGNOSIS — R55 Syncope and collapse: Secondary | ICD-10-CM | POA: Diagnosis not present

## 2020-02-07 LAB — ECHOCARDIOGRAM COMPLETE
Area-P 1/2: 2.69 cm2
Calc EF: 53.9 %
S' Lateral: 4.4 cm
Single Plane A2C EF: 55.1 %
Single Plane A4C EF: 49.4 %

## 2020-02-07 MED ORDER — ROSUVASTATIN CALCIUM 10 MG PO TABS: 10.0000 mg | ORAL_TABLET | Freq: Every day | ORAL | 3 refills | Status: DC

## 2020-02-07 MED ORDER — ASPIRIN EC 81 MG PO TBEC: 81.0000 mg | DELAYED_RELEASE_TABLET | Freq: Every day | ORAL | 3 refills | Status: AC

## 2020-02-07 NOTE — Progress Notes (Signed)
Complete echocardiogram has been performed.  Jimmy Niguel Moure RDCS, RVT 

## 2020-02-07 NOTE — Addendum Note (Signed)
Addended by: Truddie Hidden on: 02/07/2020 05:18 PM   Modules accepted: Orders

## 2020-02-10 DIAGNOSIS — D510 Vitamin B12 deficiency anemia due to intrinsic factor deficiency: Secondary | ICD-10-CM | POA: Diagnosis not present

## 2020-02-10 DIAGNOSIS — R55 Syncope and collapse: Secondary | ICD-10-CM | POA: Diagnosis not present

## 2020-02-12 ENCOUNTER — Telehealth: Payer: Self-pay

## 2020-02-12 NOTE — Telephone Encounter (Signed)
-----   Message from Berniece Salines, DO sent at 02/12/2020 12:15 PM EDT ----- Your monitor does show some rare beats that come from the top of the heart not much to really change the medication for it but I like to hear from you if your symptoms are getting worse.

## 2020-02-12 NOTE — Telephone Encounter (Signed)
Spoke with patient regarding results and recommendation.  Patient verbalizes understanding and is agreeable to plan of care. Advised patient to call back with any issues or concerns.   Patient states that he gets a little dizzy once in a great while but other than that he feels great.

## 2020-02-19 ENCOUNTER — Telehealth: Payer: Self-pay

## 2020-02-19 NOTE — Telephone Encounter (Signed)
Spoke with patient regarding results and recommendation.  Patient verbalizes understanding and is agreeable to plan of care. Advised patient to call back with any issues or concerns.  

## 2020-02-19 NOTE — Telephone Encounter (Signed)
-----   Message from Berniece Salines, DO sent at 02/18/2020  4:56 PM EDT ----- Let the patient know that he has some beats that were from the top of the heart not many there were asymptomatic, she did feel some premature atrial complexes.  For now I do not feel the need to change any medication.  We can discuss in more details at your visit.

## 2020-03-09 ENCOUNTER — Ambulatory Visit: Payer: PPO | Admitting: Neurology

## 2020-03-10 DIAGNOSIS — D510 Vitamin B12 deficiency anemia due to intrinsic factor deficiency: Secondary | ICD-10-CM | POA: Diagnosis not present

## 2020-04-14 DIAGNOSIS — E789 Disorder of lipoprotein metabolism, unspecified: Secondary | ICD-10-CM | POA: Insufficient documentation

## 2020-04-14 DIAGNOSIS — H409 Unspecified glaucoma: Secondary | ICD-10-CM | POA: Insufficient documentation

## 2020-04-16 ENCOUNTER — Telehealth: Payer: Self-pay | Admitting: Cardiology

## 2020-04-16 ENCOUNTER — Other Ambulatory Visit: Payer: Self-pay

## 2020-04-16 ENCOUNTER — Ambulatory Visit: Payer: PPO | Admitting: Cardiology

## 2020-04-16 ENCOUNTER — Encounter: Payer: Self-pay | Admitting: Cardiology

## 2020-04-16 VITALS — BP 124/72 | HR 60 | Ht 66.0 in | Wt 174.8 lb

## 2020-04-16 DIAGNOSIS — R42 Dizziness and giddiness: Secondary | ICD-10-CM | POA: Diagnosis not present

## 2020-04-16 DIAGNOSIS — E782 Mixed hyperlipidemia: Secondary | ICD-10-CM | POA: Diagnosis not present

## 2020-04-16 DIAGNOSIS — I471 Supraventricular tachycardia: Secondary | ICD-10-CM | POA: Diagnosis not present

## 2020-04-16 DIAGNOSIS — I2510 Atherosclerotic heart disease of native coronary artery without angina pectoris: Secondary | ICD-10-CM | POA: Diagnosis not present

## 2020-04-16 LAB — LIPID PANEL
Chol/HDL Ratio: 3.9 ratio (ref 0.0–5.0)
Cholesterol, Total: 181 mg/dL (ref 100–199)
HDL: 46 mg/dL (ref >39)
LDL Chol Calc (NIH): 119 mg/dL — ABNORMAL HIGH (ref 0–99)
Triglycerides: 86 mg/dL (ref 0–149)
VLDL Cholesterol Cal: 16 mg/dL (ref 5–40)

## 2020-04-16 LAB — HEPATIC FUNCTION PANEL
ALT: 10 IU/L (ref 0–44)
AST: 12 IU/L (ref 0–40)
Albumin: 4.2 g/dL (ref 3.8–4.8)
Alkaline Phosphatase: 76 IU/L (ref 44–121)
Bilirubin Total: 0.4 mg/dL (ref 0.0–1.2)
Bilirubin, Direct: 0.13 mg/dL (ref 0.00–0.40)
Total Protein: 6.9 g/dL (ref 6.0–8.5)

## 2020-04-16 MED ORDER — ROSUVASTATIN CALCIUM 5 MG PO TABS: 5.0000 mg | ORAL_TABLET | Freq: Every day | ORAL | 1 refills | Status: DC

## 2020-04-16 NOTE — Telephone Encounter (Signed)
Called pharmacy and spoke to Kirby. Confirmed with her that patient is to be on crestor 5 mg daily she verbally understood no further questions.

## 2020-04-16 NOTE — Patient Instructions (Addendum)
Medication Instructions:  Your physician has recommended you make the following change in your medications.  DECREASE: Crestor 5 mg daily   *If you need a refill on your cardiac medications before your next appointment, please call your pharmacy*   Lab Work: Labs today: lipid lft  If you have labs (blood work) drawn today and your tests are completely normal, you will receive your results only by: Marland Kitchen MyChart Message (if you have MyChart) OR . A paper copy in the mail If you have any lab test that is abnormal or we need to change your treatment, we will call you to review the results.   Testing/Procedures: Your physician has requested that you have a carotid duplex. This test is an ultrasound of the carotid arteries in your neck. It looks at blood flow through these arteries that supply the brain with blood. Allow one hour for this exam. There are no restrictions or special instructions.      Follow-Up: At [facility], you and your health needs are our priority.  As part of our continuing mission to provide you with exceptional heart care, we have created designated Provider Care Teams.  These Care Teams include your primary Cardiologist (physician) and Advanced Practice Providers (APPs -  Physician Assistants and Nurse Practitioners) who all work together to provide you with the care you need, when you need it.  We recommend signing up for the patient portal called "MyChart".  Sign up information is provided on this After Visit Summary.  MyChart is used to connect with patients for Virtual Visits (Telemedicine).  Patients are able to view lab/test results, encounter notes, upcoming appointments, etc.  Non-urgent messages can be sent to your provider as well.   To learn more about what you can do with MyChart, go to [email or web address].    Your next appointment:   6 month(s)  The format for your next appointment:   In Person  Provider:   Berniece Salines, DO   Other  Instructions

## 2020-04-16 NOTE — Progress Notes (Signed)
Cardiology Office Note:    Date:  04/16/2020   ID:  Oscar Melton, DOB February 11, 1951, MRN [id number]  PCP:  Angelina Sheriff, MD  Cardiologist:  Berniece Salines, DO  Electrophysiologist:  None   Referring MD: Angelina Sheriff, MD   " I have had some intermittent dizziness since I last saw you"  History of Present Illness:    Oscar Melton is a 69 y.o. male with a hx of hyperlipidemia-saw the patient on January 14, 2020 at that time she experiencing chest pain and also had had some dizziness and prior syncope episode. At the end of her visit I recommended a ZIO monitor and echocardiogram.  I also recommend that she get a coronary CTA.  He is here today for follow-up visit.  He tells me that he has not had much chest pain but he has had some intermittent dizziness.  He has not passed all he is concerned about this.  No other complaints at this time.  Past Medical History:  Diagnosis Date  . Borderline high cholesterol   . Glaucoma     Past Surgical History:  Procedure Laterality Date  . CATARACT EXTRACTION, BILATERAL      Current Medications: Current Meds  Medication Sig  . aspirin EC 81 MG tablet Take 1 tablet (81 mg total) by mouth daily. Swallow whole.  . Cyanocobalamin (B-12 COMPLIANCE INJECTION IJ) Inject as directed every 30 (thirty) days.  . famotidine (ACID CONTROLLER MAX ST) 20 MG tablet Take 20 mg by mouth daily.  Marland Kitchen loratadine (CLARITIN) 10 MG tablet Take 10 mg by mouth daily.  . nitroGLYCERIN (NITROSTAT) 0.4 MG SL tablet Place 1 tablet (0.4 mg total) under the tongue every 5 (five) minutes as needed for chest pain.  . rosuvastatin (CRESTOR) 5 MG tablet Take 1 tablet (5 mg total) by mouth daily.  . Zinc 50 MG CAPS Take 50 mg by mouth.  . [DISCONTINUED] rosuvastatin (CRESTOR) 10 MG tablet Take 1 tablet (10 mg total) by mouth daily.     Allergies:   Patient has no known allergies.   Social History   Socioeconomic History  . Marital status: Single    Spouse name:  Not on file  . Number of children: Not on file  . Years of education: Not on file  . Highest education level: Not on file  Occupational History  . Not on file  Tobacco Use  . Smoking status: Former Research scientist (life sciences)  . Smokeless tobacco: Never Used  Vaping Use  . Vaping Use: Never used  Substance and Sexual Activity  . Alcohol use: Never  . Drug use: Not on file  . Sexual activity: Never  Other Topics Concern  . Not on file  Social History Narrative   Right handed    Lives alone in apartment    Social Determinants of Health   Financial Resource Strain:   . Difficulty of Paying Living Expenses: Not on file  Food Insecurity:   . Worried About Charity fundraiser in the Last Year: Not on file  . Ran Out of Food in the Last Year: Not on file  Transportation Needs:   . Lack of Transportation (Medical): Not on file  . Lack of Transportation (Non-Medical): Not on file  Physical Activity:   . Days of Exercise per Week: Not on file  . Minutes of Exercise per Session: Not on file  Stress:   . Feeling of Stress : Not on file  Social Connections:   .  Frequency of Communication with Friends and Family: Not on file  . Frequency of Social Gatherings with Friends and Family: Not on file  . Attends Religious Services: Not on file  . Active Member of Clubs or Organizations: Not on file  . Attends Archivist Meetings: Not on file  . Marital Status: Not on file     Family History: The patient's family history includes AAA (abdominal aortic aneurysm) in his father; Cancer - Colon in his mother; Heart attack in his sister; Stroke in his maternal grandmother and sister; Suicidality in his brother.  ROS:   Review of Systems  Constitution: Negative for decreased appetite, fever and weight gain.  HENT: Negative for congestion, ear discharge, hoarse voice and sore throat.   Eyes: Negative for discharge, redness, vision loss in right eye and visual halos.  Cardiovascular: Negative for chest  pain, dyspnea on exertion, leg swelling, orthopnea and palpitations.  Respiratory: Negative for cough, hemoptysis, shortness of breath and snoring.   Endocrine: Negative for heat intolerance and polyphagia.  Hematologic/Lymphatic: Negative for bleeding problem. Does not bruise/bleed easily.  Skin: Negative for flushing, nail changes, rash and suspicious lesions.  Musculoskeletal: Negative for arthritis, joint pain, muscle cramps, myalgias, neck pain and stiffness.  Gastrointestinal: Negative for abdominal pain, bowel incontinence, diarrhea and excessive appetite.  Genitourinary: Negative for decreased libido, genital sores and incomplete emptying.  Neurological: Negative for brief paralysis, focal weakness, headaches and loss of balance.  Psychiatric/Behavioral: Negative for altered mental status, depression and suicidal ideas.  Allergic/Immunologic: Negative for HIV exposure and persistent infections.    EKGs/Labs/Other Studies Reviewed:    The following studies were reviewed today:   EKG: None today ZIO monitor Indication: Syncope  The minimum heart rate was 36 bpm, maximum heart rate was 144 bpm, and average heart rate was 73 bpm. Predominant underlying rhythm was Sinus Rhythm. First Degree AV Block was present.   6 Supraventricular Tachycardia runs occurred, the run with the fastest interval lasting 4 beats with a maximum rate of 144 bpm, the longest lasting 17.0 secs with an average rate of 103 bpm.  Premature atrial complexes were rare less than 1%. Premature Ventricular complexes were rare less than 1%. Ventricular Bigeminy and Trigeminy were present.  No ventricular tachycardia, no pauses, No AV block and no atrial fibrillation present. 3 out of 4 patient triggered event associated with premature atrial complex and 4 diary events associated with premature atrial complex.     Conclusion: This study is remarkable for the following:                                            1.  Asymptomatic paroxysmal atrial tachycardia with variable block.                                           2.  Symptomatic rare premature atrial complex.  Transthoracic echocardiogram IMPRESSIONS  1. Left ventricular ejection fraction, by estimation, is 50 to 55%. The  left ventricle has low normal function. The left ventricle has no regional  wall motion abnormalities. There is severe concentric left ventricular  hypertrophy. Left ventricular  diastolic parameters are consistent with Grade I diastolic dysfunction  (impaired relaxation).  2. Right ventricular systolic function is normal. The right ventricular  size is normal.  3. Left atrial size was mildly dilated.  4. The mitral valve is normal in structure. Trivial mitral valve  regurgitation. No evidence of mitral stenosis.  5. The aortic valve is normal in structure. Aortic valve regurgitation is  trivial. Mild aortic valve sclerosis is present, with no evidence of  aortic valve stenosis.  6. The inferior vena cava is normal in size with greater than 50%  respiratory variability, suggesting right atrial pressure of 3 mmHg.   Coronary CTAAorta: Normal size.  No calcifications.  No dissection.  Aortic Valve:  Trileaflet.  No calcifications.  Coronary Arteries:  Normal coronary origin.  Right dominance.  RCA is a large tortuous dominant artery that gives rise to PDA and PLVB. There is a mild (25-49%) calcified plaque in the proximal portion. Mild (25-49%) soft plaque in the mid RCA. Distal RCA with minimal (<24%) soft plaque.  Left main is a large artery that gives rise to LAD and LCX arteries.  LAD is a large vessel. There is minimal (<24%) scattered calcified plaques in the proximal LAD. The mid and distal LAD with no plaques.  LCX is a non-dominant artery that gives rise to one large OM1 branch. There is no plaque.  Other findings:  Normal pulmonary vein drainage into the left atrium.  Normal  left atrial appendage without a thrombus.  Normal size of the pulmonary artery.  IMPRESSION: 1. Coronary calcium score of 148. This was 5 percentile for age and sex matched control.  2. Normal coronary origin with right dominance.  3. Mild CAD. Recommend medical therapy for primary prevention. This study will send for FFR due to suspicion that soft plaque in the mid RCA may be causing more flow limitation due to bend at the level of the lesion.  Berniece Salines, DO  Recent Labs: 07/20/2019: ALT 17; Hemoglobin 15.4; Platelets 177 01/29/2020: BUN 11; Creatinine, Ser 1.13; Potassium 4.2; Sodium 138  Recent Lipid Panel No results found for: CHOL, TRIG, HDL, CHOLHDL, VLDL, LDLCALC, LDLDIRECT  Physical Exam:    VS:  BP 124/72   Pulse 60   Ht 5\' 6"  (1.676 m)   Wt 174 lb 12.8 oz (79.3 kg)   SpO2 95%   BMI 28.21 kg/m     Wt Readings from Last 3 Encounters:  04/16/20 174 lb 12.8 oz (79.3 kg)  01/14/20 176 lb 15.7 oz (80.3 kg)  01/06/20 176 lb (79.8 kg)     GEN: Well nourished, well developed in no acute distress HEENT: Normal NECK: No JVD; No carotid bruits LYMPHATICS: No lymphadenopathy CARDIAC: S1S2 noted,RRR, no murmurs, rubs, gallops RESPIRATORY:  Clear to auscultation without rales, wheezing or rhonchi  ABDOMEN: Soft, non-tender, non-distended, +bowel sounds, no guarding. EXTREMITIES: No edema, No cyanosis, no clubbing MUSCULOSKELETAL:  No deformity  SKIN: Warm and dry NEUROLOGIC:  Alert and oriented x 3, non-focal PSYCHIATRIC:  Normal affect, good insight  ASSESSMENT:    1. Dizziness   2. Mixed hyperlipidemia   3. Coronary artery disease involving native coronary artery of native heart without angina pectoris   4. PAT (paroxysmal atrial tachycardia) (HCC)    PLAN:     1.  We discussed all of his testing results again.  I was able to answer his questions that he had a about all of his diagnostic testing.  I am concerned about his dizziness and he has  significant family history of cardiac disease.  I like to get a bilateral carotid Doppler in this patient to make  sure that there are no significant coronary artery disease. He also reports that he is having worsening muscle and joint pain since we increase his Crestor to 10 mg.  We will cut that back to 5 mg we can get blood work today which includes lipid panel and if needed we will add Zetia.  I also encouraged to continue to take his 5 mg and drink a lot of water.  The patient is in agreement with the above plan. The patient left the office in stable condition.  The patient will follow up in 6 months or sooner if needed   Medication Adjustments/Labs and Tests Ordered: Current medicines are reviewed at length with the patient today.  Concerns regarding medicines are outlined above.  Orders Placed This Encounter  Procedures  . Lipid panel  . Hepatic function panel  . VAS US CAROTID   Meds ordered this encounter  Medications  . rosuvastatin (CRESTOR) 5 MG tablet    Sig: Take 1 tablet (5 mg total) by mouth daily.    Dispense:  90 tablet    Refill:  1    Pt will double the 5 mg until he finishes the new bottle.    There are no Patient Instructions on file for this visit.   Adopting a Healthy Lifestyle.  Know what a healthy weight is for you (roughly BMI <25) and aim to maintain this   Aim for 7+ servings of fruits and vegetables daily   65-80+ fluid ounces of water or unsweet tea for healthy kidneys   Limit to max 1 drink of alcohol per day; avoid smoking/tobacco   Limit animal fats in diet for cholesterol and heart health - choose grass fed whenever available   Avoid highly processed foods, and foods high in saturated/trans fats   Aim for low stress - take time to unwind and care for your mental health   Aim for 150 min of moderate intensity exercise weekly for heart health, and weights twice weekly for bone health   Aim for 7-9 hours of sleep daily   When it comes to  diets, agreement about the perfect plan isnt easy to find, even among the experts. Experts at the [employer] developed an idea known as the Healthy Eating Plate. Just imagine a plate divided into logical, healthy portions.   The emphasis is on diet quality:   Load up on vegetables and fruits - one-half of your plate: Aim for color and variety, and remember that potatoes dont count.   Go for whole grains - one-quarter of your plate: Whole wheat, barley, wheat berries, quinoa, oats, brown rice, and foods made with them. If you want pasta, go with whole wheat pasta.   Protein power - one-quarter of your plate: Fish, chicken, beans, and nuts are all healthy, versatile protein sources. Limit red meat.   The diet, however, does go beyond the plate, offering a few other suggestions.   Use healthy plant oils, such as olive, canola, soy, corn, sunflower and peanut. Check the labels, and avoid partially hydrogenated oil, which have unhealthy trans fats.   If youre thirsty, drink water. Coffee and tea are good in moderation, but skip sugary drinks and limit milk and dairy products to one or two daily servings.   The type of carbohydrate in the diet is more important than the amount. Some sources of carbohydrates, such as vegetables, fruits, whole grains, and beans-are healthier than others.   Finally, stay active  Signed,  Berniece Salines, DO  04/16/2020 9:42 AM    [facility]

## 2020-04-16 NOTE — Telephone Encounter (Signed)
Follow Up    Please call, concerning e-script that was sent over today for pt's Rosuvastatin.

## 2020-04-17 ENCOUNTER — Telehealth: Payer: Self-pay | Admitting: Cardiology

## 2020-04-17 NOTE — Telephone Encounter (Signed)
The patient has been notified of the result and verbalized understanding.  All questions (if any) were answered. Nuala Alpha, LPN 20/07/5595 4:16 PM

## 2020-04-17 NOTE — Telephone Encounter (Signed)
Pt called in returning call for lab results    Best number [phone]

## 2020-04-17 NOTE — Telephone Encounter (Signed)
-----   Message from Berniece Salines, DO sent at 04/17/2020 10:14 AM EST ----- Lipid profile it is okay no need for any additional medications at this time.

## 2020-05-06 DIAGNOSIS — D510 Vitamin B12 deficiency anemia due to intrinsic factor deficiency: Secondary | ICD-10-CM | POA: Diagnosis not present

## 2020-05-12 ENCOUNTER — Other Ambulatory Visit: Payer: Self-pay

## 2020-05-12 ENCOUNTER — Ambulatory Visit (INDEPENDENT_AMBULATORY_CARE_PROVIDER_SITE_OTHER): Payer: PPO

## 2020-05-12 DIAGNOSIS — E782 Mixed hyperlipidemia: Secondary | ICD-10-CM

## 2020-05-12 DIAGNOSIS — R42 Dizziness and giddiness: Secondary | ICD-10-CM | POA: Diagnosis not present

## 2020-05-12 NOTE — Progress Notes (Signed)
Complete echocardiogram performed.  Jimmy Sutter Ahlgren RDCS, RVT  

## 2020-05-13 ENCOUNTER — Telehealth: Payer: Self-pay

## 2020-05-13 ENCOUNTER — Telehealth: Payer: Self-pay | Admitting: Cardiology

## 2020-05-13 NOTE — Telephone Encounter (Signed)
-----   Message from Thomasene Ripple, DO sent at 05/12/2020  6:53 PM EST ----- Vascular ultrasound normal.

## 2020-05-13 NOTE — Telephone Encounter (Signed)
-----   Message from Kardie Tobb, DO sent at 05/12/2020  6:53 PM EST ----- Vascular ultrasound normal. 

## 2020-05-13 NOTE — Telephone Encounter (Signed)
     I went in pt's chart to see whlo called him. I transferred the call to [employer]

## 2020-05-13 NOTE — Telephone Encounter (Signed)
Spoke with patient regarding results and recommendation.  Patient verbalizes understanding and is agreeable to plan of care. Advised patient to call back with any issues or concerns.  

## 2020-05-13 NOTE — Telephone Encounter (Signed)
Left message on patients voicemail to please return our call.   

## 2020-05-14 DIAGNOSIS — J309 Allergic rhinitis, unspecified: Secondary | ICD-10-CM | POA: Diagnosis not present

## 2020-05-14 DIAGNOSIS — Z20828 Contact with and (suspected) exposure to other viral communicable diseases: Secondary | ICD-10-CM | POA: Diagnosis not present

## 2020-05-16 DIAGNOSIS — Z20828 Contact with and (suspected) exposure to other viral communicable diseases: Secondary | ICD-10-CM | POA: Diagnosis not present

## 2020-05-16 DIAGNOSIS — R059 Cough, unspecified: Secondary | ICD-10-CM | POA: Diagnosis not present

## 2020-06-08 DIAGNOSIS — D510 Vitamin B12 deficiency anemia due to intrinsic factor deficiency: Secondary | ICD-10-CM | POA: Diagnosis not present

## 2020-07-08 DIAGNOSIS — D510 Vitamin B12 deficiency anemia due to intrinsic factor deficiency: Secondary | ICD-10-CM | POA: Diagnosis not present

## 2020-07-28 DIAGNOSIS — M1990 Unspecified osteoarthritis, unspecified site: Secondary | ICD-10-CM | POA: Diagnosis not present

## 2020-07-28 DIAGNOSIS — Z1331 Encounter for screening for depression: Secondary | ICD-10-CM | POA: Diagnosis not present

## 2020-07-28 DIAGNOSIS — Z79899 Other long term (current) drug therapy: Secondary | ICD-10-CM | POA: Diagnosis not present

## 2020-07-28 DIAGNOSIS — H409 Unspecified glaucoma: Secondary | ICD-10-CM | POA: Diagnosis not present

## 2020-07-28 DIAGNOSIS — Z0000 Encounter for general adult medical examination without abnormal findings: Secondary | ICD-10-CM | POA: Diagnosis not present

## 2020-07-28 DIAGNOSIS — Z1211 Encounter for screening for malignant neoplasm of colon: Secondary | ICD-10-CM | POA: Diagnosis not present

## 2020-07-28 DIAGNOSIS — Z6826 Body mass index (BMI) 26.0-26.9, adult: Secondary | ICD-10-CM | POA: Diagnosis not present

## 2020-07-28 DIAGNOSIS — E7800 Pure hypercholesterolemia, unspecified: Secondary | ICD-10-CM | POA: Diagnosis not present

## 2020-07-28 DIAGNOSIS — F419 Anxiety disorder, unspecified: Secondary | ICD-10-CM | POA: Diagnosis not present

## 2020-07-28 DIAGNOSIS — K219 Gastro-esophageal reflux disease without esophagitis: Secondary | ICD-10-CM | POA: Diagnosis not present

## 2020-07-28 DIAGNOSIS — D510 Vitamin B12 deficiency anemia due to intrinsic factor deficiency: Secondary | ICD-10-CM | POA: Diagnosis not present

## 2020-07-28 DIAGNOSIS — Z125 Encounter for screening for malignant neoplasm of prostate: Secondary | ICD-10-CM | POA: Diagnosis not present

## 2020-07-30 ENCOUNTER — Telehealth: Payer: Self-pay | Admitting: Cardiology

## 2020-07-30 NOTE — Telephone Encounter (Signed)
Will forward to Dr. Harriet Masson. Did not see any recent labs from patient's PCP.

## 2020-07-30 NOTE — Telephone Encounter (Signed)
Please ask him to have her PCP office and lab dosages have not received it yet.

## 2020-07-30 NOTE — Telephone Encounter (Signed)
Called patient back to let him know that he needs to have his PCP decided which dose to take since they know his lab results. Informed patient that we have not received lab results at this time yet.

## 2020-07-30 NOTE — Telephone Encounter (Signed)
Pt c/o medication issue:  1. Name of Medication: Rosuvastatin 10 MG   2. How are you currently taking this medication (dosage and times per day)? 1 tablet by mouth once daily   3. Are you having a reaction (difficulty breathing--STAT)? No   4. What is your medication issue?   Patient states he recently had lab work with his PCP and they are recommending an increase increase from 10 MG to 20 MG due to his cholesterol. He states their office faxed the results to our office and he would like to know if it has been received and what Dr. Terrial Rhodes

## 2020-08-24 ENCOUNTER — Ambulatory Visit: Payer: PPO | Admitting: Neurology

## 2020-09-07 ENCOUNTER — Ambulatory Visit: Payer: PPO | Admitting: Neurology

## 2020-09-16 DIAGNOSIS — D510 Vitamin B12 deficiency anemia due to intrinsic factor deficiency: Secondary | ICD-10-CM | POA: Diagnosis not present

## 2020-09-18 DIAGNOSIS — J324 Chronic pansinusitis: Secondary | ICD-10-CM | POA: Diagnosis not present

## 2020-09-18 DIAGNOSIS — R509 Fever, unspecified: Secondary | ICD-10-CM | POA: Diagnosis not present

## 2020-09-22 ENCOUNTER — Telehealth: Payer: Self-pay | Admitting: Cardiology

## 2020-09-22 MED ORDER — ROSUVASTATIN CALCIUM 20 MG PO TABS: 20.0000 mg | ORAL_TABLET | Freq: Every day | ORAL | 3 refills | Status: DC

## 2020-09-22 NOTE — Telephone Encounter (Signed)
Pt c/o medication issue:  1. Name of Medication: rosuvastatin (CRESTOR) 5 MG tablet(Expired)  2. How are you currently taking this medication (dosage and times per day)? Take 1 tablet (5 mg total) by mouth daily.  3. Are you having a reaction (difficulty breathing--STAT)? no  4. What is your medication issue? Pt told pharmacist that this script was supposed to be changed to 20MG  instead of 5MG 

## 2020-09-22 NOTE — Telephone Encounter (Signed)
Prescription for 20 mg tablets called in at this time per Dr. Terrial Rhodes recommendations.

## 2020-10-15 ENCOUNTER — Telehealth: Payer: Self-pay

## 2020-10-15 ENCOUNTER — Ambulatory Visit: Payer: PPO | Admitting: Cardiology

## 2020-10-15 ENCOUNTER — Other Ambulatory Visit: Payer: Self-pay

## 2020-10-15 ENCOUNTER — Encounter: Payer: Self-pay | Admitting: Cardiology

## 2020-10-15 VITALS — BP 110/70 | HR 77 | Ht 66.0 in | Wt 169.2 lb

## 2020-10-15 DIAGNOSIS — R42 Dizziness and giddiness: Secondary | ICD-10-CM

## 2020-10-15 DIAGNOSIS — E785 Hyperlipidemia, unspecified: Secondary | ICD-10-CM

## 2020-10-15 DIAGNOSIS — I471 Supraventricular tachycardia: Secondary | ICD-10-CM | POA: Diagnosis not present

## 2020-10-15 DIAGNOSIS — E559 Vitamin D deficiency, unspecified: Secondary | ICD-10-CM

## 2020-10-15 DIAGNOSIS — E8881 Metabolic syndrome: Secondary | ICD-10-CM

## 2020-10-15 DIAGNOSIS — I2510 Atherosclerotic heart disease of native coronary artery without angina pectoris: Secondary | ICD-10-CM | POA: Diagnosis not present

## 2020-10-15 DIAGNOSIS — I4719 Other supraventricular tachycardia: Secondary | ICD-10-CM

## 2020-10-15 MED ORDER — EZETIMIBE 10 MG PO TABS: 10.0000 mg | ORAL_TABLET | Freq: Every day | ORAL | 3 refills | Status: DC

## 2020-10-15 NOTE — Progress Notes (Signed)
Cardiology Office Note:    Date:  10/15/2020   ID:  Oscar Melton, DOB 1950/10/09, MRN [id number]  PCP:  Angelina Sheriff, MD  Cardiologist:  Berniece Salines, DO  Electrophysiologist:  None   Referring MD: Angelina Sheriff, MD   I am feeling dizzy  History of Present Illness:    Oscar Melton is a 70 y.o. male with a hx of  hyperlipidemia-saw the patient on January 14, 2020 at that time she experiencing chest pain and also had had some dizziness and prior syncope episode.  At his last visit we discussed his testing results.  Given his dizziness we did bilateral carotid which showed normal vessels.  He also had worsening of his multiple joint.  He was on Crestor 10 mg a day.  At that time I recommended we cut back on her Crestor add Zetia.  But he still is taking the Crestor 10 mg daily.  His recent blood work with his PCP showed that his LDL was still greater than 70%.  We are going to add Zetia to his current medication dose.  Today he tells me he has been experiencing similar dizziness.  He notes that it is mostly with head movement.  He notes he is fine until he wakes up and is moving his head when he is doing effectivity he will get dizzy.  Has not passed out.  Past Medical History:  Diagnosis Date  . Borderline high cholesterol   . Glaucoma     Past Surgical History:  Procedure Laterality Date  . CATARACT EXTRACTION, BILATERAL      Current Medications: Current Meds  Medication Sig  . aspirin EC 81 MG tablet Take 1 tablet (81 mg total) by mouth daily. Swallow whole.  . COLLAGEN PO Take 1 tablet by mouth daily. Strength unknown  . Cyanocobalamin (B-12 COMPLIANCE INJECTION IJ) Inject as directed every 30 (thirty) days.  Marland Kitchen ezetimibe (ZETIA) 10 MG tablet Take 1 tablet (10 mg total) by mouth daily.  . famotidine (PEPCID) 20 MG tablet Take 20 mg by mouth daily.  Marland Kitchen GLUCOSAMINE HCL PO Take 1 tablet by mouth daily at 6 (six) AM. Unknown strength  . loratadine (CLARITIN) 10 MG  tablet Take 10 mg by mouth daily.  . nitroGLYCERIN (NITROSTAT) 0.4 MG SL tablet Place 1 tablet (0.4 mg total) under the tongue every 5 (five) minutes as needed for chest pain.  . rosuvastatin (CRESTOR) 10 MG tablet Take 10 mg by mouth daily.  . Zinc 50 MG CAPS Take 50 mg by mouth.  . [DISCONTINUED] metoprolol succinate (TOPROL-XL) 100 MG 24 hr tablet Take 100 mg by mouth daily. Take with or immediately following a meal.  . [DISCONTINUED] metoprolol tartrate (LOPRESSOR) 100 MG tablet Take 1 tablet (100 mg total) by mouth once for 1 dose. 2 hours prior to procedure  . [DISCONTINUED] rosuvastatin (CRESTOR) 20 MG tablet Take 1 tablet (20 mg total) by mouth daily.     Allergies:   Lipitor [atorvastatin]   Social History   Socioeconomic History  . Marital status: Single    Spouse name: Not on file  . Number of children: Not on file  . Years of education: Not on file  . Highest education level: Not on file  Occupational History  . Not on file  Tobacco Use  . Smoking status: Former Research scientist (life sciences)  . Smokeless tobacco: Never Used  Vaping Use  . Vaping Use: Never used  Substance and Sexual Activity  . Alcohol use:  Never  . Drug use: Not on file  . Sexual activity: Never  Other Topics Concern  . Not on file  Social History Narrative   Right handed    Lives alone in apartment    Social Determinants of Health   Financial Resource Strain: Not on file  Food Insecurity: Not on file  Transportation Needs: Not on file  Physical Activity: Not on file  Stress: Not on file  Social Connections: Not on file     Family History: The patient's family history includes AAA (abdominal aortic aneurysm) in his father; Cancer - Colon in his mother; Heart attack in his sister; Stroke in his maternal grandmother and sister; Suicidality in his brother.  ROS:   Review of Systems  Constitution: Negative for decreased appetite, fever and weight gain.  HENT: Negative for congestion, ear discharge, hoarse voice  and sore throat.   Eyes: Negative for discharge, redness, vision loss in right eye and visual halos.  Cardiovascular: Negative for chest pain, dyspnea on exertion, leg swelling, orthopnea and palpitations.  Respiratory: Negative for cough, hemoptysis, shortness of breath and snoring.   Endocrine: Negative for heat intolerance and polyphagia.  Hematologic/Lymphatic: Negative for bleeding problem. Does not bruise/bleed easily.  Skin: Negative for flushing, nail changes, rash and suspicious lesions.  Musculoskeletal: Negative for arthritis, joint pain, muscle cramps, myalgias, neck pain and stiffness.  Gastrointestinal: Negative for abdominal pain, bowel incontinence, diarrhea and excessive appetite.  Genitourinary: Negative for decreased libido, genital sores and incomplete emptying.  Neurological: Negative for brief paralysis, focal weakness, headaches and loss of balance.  Psychiatric/Behavioral: Negative for altered mental status, depression and suicidal ideas.  Allergic/Immunologic: Negative for HIV exposure and persistent infections.    EKGs/Labs/Other Studies Reviewed:    The following studies were reviewed today:   EKG: None today  Zio monitor The patient wore the monitor for 13 days 23 hours starting January 14, 2020. Indication: Syncope  The minimum heart rate was 36 bpm, maximum heart rate was 144 bpm, and average heart rate was 73 bpm. Predominant underlying rhythm was Sinus Rhythm. First Degree AV Block was present.   6 Supraventricular Tachycardia runs occurred, the run with the fastest interval lasting 4 beats with a maximum rate of 144 bpm, the longest lasting 17.0 secs with an average rate of 103 bpm.  Premature atrial complexes were rare less than 1%. Premature Ventricular complexes were rare less than 1%. Ventricular Bigeminy and Trigeminy were present.  No ventricular tachycardia, no pauses, No AV block and no atrial fibrillation present. 3 out of 4 patient  triggered event associated with premature atrial complex and 4 diary events associated with premature atrial complex.     Conclusion: This study is remarkable for the following:                                           1.  Asymptomatic paroxysmal atrial tachycardia with variable block.                                           2.  Symptomatic rare premature atrial complex.   Transthoracic echocardiogram February 07, 2020 IMPRESSIONS: 1. Left ventricular ejection fraction, by estimation, is 50 to 55%. The left ventricle has low normal function. The left ventricle has no  regional wall motion abnormalities. There is severe concentric left ventricular hypertrophy. Left ventricular  diastolic parameters are consistent with Grade I diastolic dysfunction (impaired relaxation).  2. Right ventricular systolic function is normal. The right ventricular size is normal.  3. Left atrial size was mildly dilated.  4. The mitral valve is normal in structure. Trivial mitral valve  regurgitation. No evidence of mitral stenosis.  5. The aortic valve is normal in structure. Aortic valve regurgitation is trivial. Mild aortic valve sclerosis is present, with no evidence of aortic valve stenosis.  6. The inferior vena cava is normal in size with greater than 50% respiratory variability, suggesting right atrial pressure of 3 mmHg.  Comparison(s): No prior Echocardiogram.   FINDINGS  Left Ventricle: Left ventricular ejection fraction, by estimation, is 50 to 55%. The left ventricle has low normal function. The left ventricle has no regional wall motion abnormalities. The left ventricular internal cavity size was normal in size.  There is severe concentric left ventricular hypertrophy. Left ventricular diastolic parameters are consistent with Grade I diastolic dysfunction (impaired relaxation).   Right Ventricle: The right ventricular size is normal. No increase in right ventricular wall thickness. Right  ventricular systolic function is normal.   Left Atrium: Left atrial size was mildly dilated.   Right Atrium: Right atrial size was normal in size.   Pericardium: There is no evidence of pericardial effusion.   Mitral Valve: The mitral valve is normal in structure. Trivial mitral valve regurgitation. No evidence of mitral valve stenosis.   Tricuspid Valve: The tricuspid valve is normal in structure. Tricuspid valve regurgitation is not demonstrated. No evidence of tricuspid stenosis.   Aortic Valve: The aortic valve is normal in structure. Aortic valve regurgitation is trivial. Mild aortic valve sclerosis is present, with no  evidence of aortic valve stenosis.   Pulmonic Valve: The pulmonic valve was normal in structure. Pulmonic valve regurgitation is not visualized. No evidence of pulmonic stenosis.   Aorta: The aortic root is normal in size and structure.   Venous: The inferior vena cava is normal in size with greater than 50% respiratory variability, suggesting right atrial pressure of 3 mmHg.   IAS/Shunts: No atrial level shunt detected by color flow Doppler.    Recent Labs: 01/29/2020: BUN 11; Creatinine, Ser 1.13; Potassium 4.2; Sodium 138 04/16/2020: ALT 10  Recent Lipid Panel    Component Value Date/Time   CHOL 181 04/16/2020 0955   TRIG 86 04/16/2020 0955   HDL 46 04/16/2020 0955   CHOLHDL 3.9 04/16/2020 0955   LDLCALC 119 (H) 04/16/2020 0955    Physical Exam:    VS:  BP 110/70   Pulse 77   Ht 5\' 6"  (1.676 m)   Wt 169 lb 3.2 oz (76.7 kg)   SpO2 97%   BMI 27.31 kg/m     Wt Readings from Last 3 Encounters:  10/15/20 169 lb 3.2 oz (76.7 kg)  04/16/20 174 lb 12.8 oz (79.3 kg)  01/14/20 176 lb 15.7 oz (80.3 kg)     GEN: Well nourished, well developed in no acute distress HEENT: Normal NECK: No JVD; No carotid bruits LYMPHATICS: No lymphadenopathy CARDIAC: S1S2 noted,RRR, no murmurs, rubs, gallops RESPIRATORY:  Clear to auscultation without rales, wheezing  or rhonchi  ABDOMEN: Soft, non-tender, non-distended, +bowel sounds, no guarding. EXTREMITIES: No edema, No cyanosis, no clubbing MUSCULOSKELETAL:  No deformity  SKIN: Warm and dry NEUROLOGIC:  Alert and oriented x 3, non-focal PSYCHIATRIC:  Normal affect, good insight  ASSESSMENT:  1. Dizziness   2. Hyperlipidemia, unspecified hyperlipidemia type   3. Metabolic syndrome   4. Vitamin D deficiency   5. PAT (paroxysmal atrial tachycardia) ([place])   6. Mild CAD    PLAN:     We will continue his Crestor 10 mg daily.  Add Zetia 10 mg daily to his regimen.  We talked about his carotid ultrasound which was normal.  I like to send the patient to ENT to evaluate his inner ear for his dizziness. In the meantime he has had some fatigue vitamin D level will be done.  He is concerned with his metabolic syndrome from diabetes we will get hemoglobin A1c.  Monitor symptoms.  The patient is in agreement with the above plan. The patient left the office in stable condition.  The patient will follow up in 1 year or sooner if needed.   Medication Adjustments/Labs and Tests Ordered: Current medicines are reviewed at length with the patient today.  Concerns regarding medicines are outlined above.  Orders Placed This Encounter  Procedures  . Hemoglobin A1c  . VITAMIN D 25 Hydroxy (Vit-D Deficiency, Fractures)  . Ambulatory referral to ENT   Meds ordered this encounter  Medications  . ezetimibe (ZETIA) 10 MG tablet    Sig: Take 1 tablet (10 mg total) by mouth daily.    Dispense:  90 tablet    Refill:  3    Patient Instructions  Medication Instructions:  Your physician has recommended you make the following change in your medication:  START: Coenzyme Q10 - follow instructions on bottle START: Zetia 10 mg once daily *If you need a refill on your cardiac medications before your next appointment, please call your pharmacy*   Lab Work: Your physician recommends that you return for lab  work: TODAY: Vitamin D, HbgA1C If you have labs (blood work) drawn today and your tests are completely normal, you will receive your results only by: Marland Kitchen MyChart Message (if you have MyChart) OR . A paper copy in the mail If you have any lab test that is abnormal or we need to change your treatment, we will call you to review the results.   Testing/Procedures: None   Follow-Up: At [facility], you and your health needs are our priority.  As part of our continuing mission to provide you with exceptional heart care, we have created designated Provider Care Teams.  These Care Teams include your primary Cardiologist (physician) and Advanced Practice Providers (APPs -  Physician Assistants and Nurse Practitioners) who all work together to provide you with the care you need, when you need it.  We recommend signing up for the patient portal called "MyChart".  Sign up information is provided on this After Visit Summary.  MyChart is used to connect with patients for Virtual Visits (Telemedicine).  Patients are able to view lab/test results, encounter notes, upcoming appointments, etc.  Non-urgent messages can be sent to your provider as well.   To learn more about what you can do with MyChart, go to [email or web address].    Your next appointment:   1 year(s)  The format for your next appointment:   In Person  Provider:   Berniece Salines, DO   Other Instructions      Adopting a Healthy Lifestyle.  Know what a healthy weight is for you (roughly BMI <25) and aim to maintain this   Aim for 7+ servings of fruits and vegetables daily   65-80+ fluid ounces of water or unsweet tea for healthy  kidneys   Limit to max 1 drink of alcohol per day; avoid smoking/tobacco   Limit animal fats in diet for cholesterol and heart health - choose grass fed whenever available   Avoid highly processed foods, and foods high in saturated/trans fats   Aim for low stress - take time to unwind and care for  your mental health   Aim for 150 min of moderate intensity exercise weekly for heart health, and weights twice weekly for bone health   Aim for 7-9 hours of sleep daily   When it comes to diets, agreement about the perfect plan isnt easy to find, even among the experts. Experts at the [place] developed an idea known as the Healthy Eating Plate. Just imagine a plate divided into logical, healthy portions.   The emphasis is on diet quality:   Load up on vegetables and fruits - one-half of your plate: Aim for color and variety, and remember that potatoes dont count.   Go for whole grains - one-quarter of your plate: Whole wheat, barley, wheat berries, quinoa, oats, brown rice, and foods made with them. If you want pasta, go with whole wheat pasta.   Protein power - one-quarter of your plate: Fish, chicken, beans, and nuts are all healthy, versatile protein sources. Limit red meat.   The diet, however, does go beyond the plate, offering a few other suggestions.   Use healthy plant oils, such as olive, canola, soy, corn, sunflower and peanut. Check the labels, and avoid partially hydrogenated oil, which have unhealthy trans fats.   If youre thirsty, drink water. Coffee and tea are good in moderation, but skip sugary drinks and limit milk and dairy products to one or two daily servings.   The type of carbohydrate in the diet is more important than the amount. Some sources of carbohydrates, such as vegetables, fruits, whole grains, and beans-are healthier than others.   Finally, stay active  Signed, Berniece Salines, DO  10/15/2020 3:14 PM    [facility]

## 2020-10-15 NOTE — Telephone Encounter (Signed)
Left message for patient to return our call.

## 2020-10-15 NOTE — Patient Instructions (Signed)
Medication Instructions:  Your physician has recommended you make the following change in your medication:  START: Coenzyme Q10 - follow instructions on bottle START: Zetia 10 mg once daily *If you need a refill on your cardiac medications before your next appointment, please call your pharmacy*   Lab Work: Your physician recommends that you return for lab work: TODAY: Vitamin D, HbgA1C If you have labs (blood work) drawn today and your tests are completely normal, you will receive your results only by: Marland Kitchen MyChart Message (if you have MyChart) OR . A paper copy in the mail If you have any lab test that is abnormal or we need to change your treatment, we will call you to review the results.   Testing/Procedures: None   Follow-Up: At [facility], you and your health needs are our priority.  As part of our continuing mission to provide you with exceptional heart care, we have created designated Provider Care Teams.  These Care Teams include your primary Cardiologist (physician) and Advanced Practice Providers (APPs -  Physician Assistants and Nurse Practitioners) who all work together to provide you with the care you need, when you need it.  We recommend signing up for the patient portal called "MyChart".  Sign up information is provided on this After Visit Summary.  MyChart is used to connect with patients for Virtual Visits (Telemedicine).  Patients are able to view lab/test results, encounter notes, upcoming appointments, etc.  Non-urgent messages can be sent to your provider as well.   To learn more about what you can do with MyChart, go to [email or web address].    Your next appointment:   1 year(s)  The format for your next appointment:   In Person  Provider:   Berniece Salines, DO   Other Instructions

## 2020-10-16 LAB — VITAMIN D 25 HYDROXY (VIT D DEFICIENCY, FRACTURES): Vit D, 25-Hydroxy: 29.2 ng/mL — ABNORMAL LOW (ref 30.0–100.0)

## 2020-10-16 LAB — HEMOGLOBIN A1C
Est. average glucose Bld gHb Est-mCnc: 120 mg/dL
Hgb A1c MFr Bld: 5.8 % — ABNORMAL HIGH (ref 4.8–5.6)

## 2020-10-16 NOTE — Telephone Encounter (Signed)
    Pt is calling back to follow up  

## 2020-10-16 NOTE — Telephone Encounter (Signed)
Patient is returning call.  °

## 2020-10-19 ENCOUNTER — Telehealth: Payer: Self-pay

## 2020-10-19 MED ORDER — VITAMIN D (ERGOCALCIFEROL) 1.25 MG (50000 UNIT) PO CAPS: 50000.0000 [IU] | ORAL_CAPSULE | ORAL | 0 refills | Status: AC

## 2020-10-19 NOTE — Telephone Encounter (Signed)
Left message for patient to return our call.

## 2020-10-19 NOTE — Telephone Encounter (Signed)
Spoke with patient. Patient verbalizes understanding. No further questions or concerns at this time.

## 2020-10-19 NOTE — Addendum Note (Signed)
Addended by: Orvan July on: 10/19/2020 08:31 AM   Modules accepted: Orders

## 2020-10-27 ENCOUNTER — Ambulatory Visit: Payer: PPO | Admitting: Neurology

## 2020-10-27 ENCOUNTER — Encounter: Payer: Self-pay | Admitting: Neurology

## 2020-10-27 ENCOUNTER — Other Ambulatory Visit: Payer: Self-pay

## 2020-10-27 VITALS — BP 135/83 | HR 58 | Ht 66.0 in | Wt 169.6 lb

## 2020-10-27 DIAGNOSIS — R55 Syncope and collapse: Secondary | ICD-10-CM | POA: Diagnosis not present

## 2020-10-27 DIAGNOSIS — R42 Dizziness and giddiness: Secondary | ICD-10-CM

## 2020-10-27 NOTE — Patient Instructions (Addendum)
Referral will be sent to [facility]NT  2. Try the vestibular exercises at home   3. Continue to monitor symptoms, follow-up in 6-8 months, call for any changes

## 2020-10-27 NOTE — Progress Notes (Signed)
Has been getting dizzy at times cardiology knows. Pt is pre diabetic,  pt set up to see ENT for vertigo

## 2020-10-27 NOTE — Progress Notes (Signed)
NEUROLOGY FOLLOW UP OFFICE NOTE  Oscar Melton [id number] 1950-07-12  HISTORY OF PRESENT ILLNESS: I had the pleasure of seeing Oscar Melton in follow-up in the neurology clinic on 10/27/2020.  The patient was last seen 10 months ago. He had an episode of loss of consciousness last 07/20/2019. Brain MRI with and without contrast did not show any acute changes, there was mild chronic microvascular disease, vertebrobasilar dolichoectasia. His 1-hour wake and sleep EEG showed left temporal sharp waves, however follow-up 61-hour ambulatory EEG showed sharply contoured left temporal wave forms that were not clearly epileptogenic,within normal limits. His 1-lead EKG showed occasional extrasystolic beats and he reported episodes of dizziness with no EEG or EKG changes seen. He was kindly evaluated by cardiologist Dr. Harriet Masson, carotid dopplers normal. Zio monitor for 13 days showed asymptomatic paroxysmal atrial tachycardia with variable block, symptomatic rare premature atrial complex. Echocardiogram showed EF 50-55%, severe concentric LVH. On his last visit with Cardiology, he reported positional dizziness and was referred to ENT.  He denies any syncopal episodes since 07/20/2019. No staring/unresponsive episodes, gaps in time, olfactory/gustatory hallucinations, focal numbness/tingling/weakness, myoclonic jerks. He was previously reporting dizziness in bed, this had resolved but recently recurred. He previously saw Vestibular therapy and was told he did not have vertigo on their exam. He reports that over the past month, he has daily brief dizzy spells when he moves his head, makes a rapid movement, or looks up and down. When he lays back down in bed, there would be slight spinning/lightheadedness. No associated nausea, headache, focal symptoms. He wonders if symptoms are due to prediabetes.     History on Initial Assessment 08/20/2019: This is a pleasant 70 year old right-handed man with a history of  hyperlipidemia, in his usual state of health until 07/20/2019 when he had an episode of loss of consciousness at work. He recalls sitting at the break room and eating a couple of cupcakes, getting up, then has no recollection of events. He denies any prior warning symptoms, however in the ER reported that he began to feel "swimmy headed" and felt like he needed to sit down. When he feels this sensation, it feels like shots of light happen in his head. His coworkers say he was walking in the hallway holding his shoes in his hands and not making sense. He was apparently trying to make a call in the ambulance but does not recall talking to family. Coworkers reported he had vomited. He had a laceration in the posterior scalp requiring staples. Bloodwork showed a WBC of 15.3, K 3.3, UA and UDS negative. I personally reviewed head CT which did not show any acute intracranial abnormalities, there was right occipital scalp swelling and hematoma. 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   He reports the flashes in his head have been ongoing since 2005. He suddenly has flashes in his eyes/head, like strobe lights. He feels like he cannot focus and cannot figure things out. He cannot see well when they happen. There are no associated headache, focal symptoms. He mostly notices them when he goes in a store in [place] around once a month and has to sit down. He states they don't occur at home. He reports a lot of anxiety, he recalls when he was in the 6th grade in 1967, he went to bed and got up "nervous as a cat ever since." He is shaky at different times, sometimes affecting his handwriting. No tremors today. He lives alone and denies  being told of any staring/unresponsive episodes. He has had dizzy spells since the fall, with a spinning sensation. When he lays down his head feels like it is running around inside. He denies any neck/back pain, bowel/bladder dysfunction. Sleep is fairly  good. Memory is not as good. He had a normal birth and early development.  There is no history of febrile convulsions, CNS infections such as meningitis/encephalitis, significant traumatic brain injury, neurosurgical procedures, or family history of seizures.  PAST MEDICAL HISTORY: Past Medical History:  Diagnosis Date   Borderline high cholesterol    Glaucoma     MEDICATIONS: Current Outpatient Medications on File Prior to Visit  Medication Sig Dispense Refill   aspirin EC 81 MG tablet Take 1 tablet (81 mg total) by mouth daily. Swallow whole. 90 tablet 3   COLLAGEN PO Take 1 tablet by mouth daily. Strength unknown     Cyanocobalamin (B-12 COMPLIANCE INJECTION IJ) Inject as directed every 30 (thirty) days.     ezetimibe (ZETIA) 10 MG tablet Take 1 tablet (10 mg total) by mouth daily. 90 tablet 3   famotidine (PEPCID) 20 MG tablet Take 20 mg by mouth daily.     GLUCOSAMINE HCL PO Take 1 tablet by mouth daily at 6 (six) AM. Unknown strength     loratadine (CLARITIN) 10 MG tablet Take 10 mg by mouth daily.     nitroGLYCERIN (NITROSTAT) 0.4 MG SL tablet Place 1 tablet (0.4 mg total) under the tongue every 5 (five) minutes as needed for chest pain. 25 tablet 3   rosuvastatin (CRESTOR) 10 MG tablet Take 10 mg by mouth daily.     Vitamin D, Ergocalciferol, (DRISDOL) 1.25 MG (50000 UNIT) CAPS capsule Take 1 capsule (50,000 Units total) by mouth every 7 (seven) days. Once weekly. 12 capsule 0   Zinc 50 MG CAPS Take 50 mg by mouth.     No current facility-administered medications on file prior to visit.    ALLERGIES: Allergies  Allergen Reactions   Lipitor [Atorvastatin] Other (See Comments)    Joint pain    FAMILY HISTORY: Family History  Problem Relation Age of Onset   Cancer - Colon Mother    AAA (abdominal aortic aneurysm) Father    Stroke Sister    Heart attack Sister    Suicidality Brother    Stroke Maternal Grandmother     SOCIAL HISTORY: Social History   Socioeconomic  History   Marital status: Single    Spouse name: Not on file   Number of children: Not on file   Years of education: Not on file   Highest education level: Not on file  Occupational History   Not on file  Tobacco Use   Smoking status: Former    Pack years: 0.00   Smokeless tobacco: Never  Vaping Use   Vaping Use: Never used  Substance and Sexual Activity   Alcohol use: Never   Drug use: Not on file   Sexual activity: Never  Other Topics Concern   Not on file  Social History Narrative   Right handed    Lives alone in apartment    Social Determinants of Health   Financial Resource Strain: Not on file  Food Insecurity: Not on file  Transportation Needs: Not on file  Physical Activity: Not on file  Stress: Not on file  Social Connections: Not on file  Intimate Partner Violence: Not on file     PHYSICAL EXAM: Vitals:   10/27/20 1520  BP: 135/83  Pulse: (!) 58  SpO2: 94%   General: No acute distress, verbose  Head:  Normocephalic/atraumatic Skin/Extremities: No rash, no edema Neurological Exam: alert and awake. No aphasia or dysarthria. Fund of knowledge is appropriate.  Recent and remote memory are intact.  Attention and concentration are normal.   Cranial nerves: Pupils equal, round. Extraocular movements intact with no nystagmus. Visual fields full.  No facial asymmetry.  Motor: Bulk and tone normal, muscle strength 5/5 throughout with no pronator drift.   Finger to nose testing intact.  Gait narrow-based and steady, able to tandem walk adequately.  Romberg negative.   IMPRESSION: This is a pleasant 70 yo RH man with a history of hyperlipidemia with a syncopal episode last 07/20/19. MRI brain unremarkable. His initial EEG showed left temporal sharp waves, however prolonged EEG done after did not show any clear epileptiform activity. No further syncopal episodes since March 2021. No seizures or seizure-like symptoms. He has been evaluated by Cardiology as well and referred  to ENT for the positional dizziness. He had tried vestibular therapy in the past and was told he did not have vertigo, however symptoms reported today suggestive of positional dizziness. He was given home Epley exercises to try. He would like to stay local in [facility] for ENT evaluation, referral sent. [place] driving laws were again discussed, he knows to stop driving after an episode of loss of consciousness until 6 months event-free. Follow-up in 6-8 months, he knows to call for any changes.     Thank you for allowing me to participate in his care.  Please do not hesitate to call for any questions or concerns.   Ellouise Newer, M.D.   CC: Dr. Lin Landsman, Dr. Harriet Masson

## 2020-10-30 DIAGNOSIS — D510 Vitamin B12 deficiency anemia due to intrinsic factor deficiency: Secondary | ICD-10-CM | POA: Diagnosis not present

## 2020-12-03 DIAGNOSIS — E538 Deficiency of other specified B group vitamins: Secondary | ICD-10-CM | POA: Diagnosis not present

## 2021-01-07 DIAGNOSIS — L820 Inflamed seborrheic keratosis: Secondary | ICD-10-CM | POA: Diagnosis not present

## 2021-01-26 DIAGNOSIS — E538 Deficiency of other specified B group vitamins: Secondary | ICD-10-CM | POA: Diagnosis not present

## 2021-03-22 DIAGNOSIS — D510 Vitamin B12 deficiency anemia due to intrinsic factor deficiency: Secondary | ICD-10-CM | POA: Diagnosis not present

## 2021-04-22 DIAGNOSIS — D510 Vitamin B12 deficiency anemia due to intrinsic factor deficiency: Secondary | ICD-10-CM | POA: Diagnosis not present

## 2021-04-26 DIAGNOSIS — H401212 Low-tension glaucoma, right eye, moderate stage: Secondary | ICD-10-CM | POA: Diagnosis not present

## 2021-04-26 DIAGNOSIS — H401221 Low-tension glaucoma, left eye, mild stage: Secondary | ICD-10-CM | POA: Diagnosis not present

## 2021-04-26 DIAGNOSIS — H52223 Regular astigmatism, bilateral: Secondary | ICD-10-CM | POA: Diagnosis not present

## 2021-06-01 ENCOUNTER — Ambulatory Visit: Payer: PPO | Admitting: Neurology

## 2021-06-17 DIAGNOSIS — D510 Vitamin B12 deficiency anemia due to intrinsic factor deficiency: Secondary | ICD-10-CM | POA: Diagnosis not present

## 2021-07-15 DIAGNOSIS — L820 Inflamed seborrheic keratosis: Secondary | ICD-10-CM | POA: Diagnosis not present

## 2021-07-29 DIAGNOSIS — Z6826 Body mass index (BMI) 26.0-26.9, adult: Secondary | ICD-10-CM | POA: Diagnosis not present

## 2021-07-29 DIAGNOSIS — M1990 Unspecified osteoarthritis, unspecified site: Secondary | ICD-10-CM | POA: Diagnosis not present

## 2021-07-29 DIAGNOSIS — I2510 Atherosclerotic heart disease of native coronary artery without angina pectoris: Secondary | ICD-10-CM | POA: Diagnosis not present

## 2021-07-29 DIAGNOSIS — H409 Unspecified glaucoma: Secondary | ICD-10-CM | POA: Diagnosis not present

## 2021-07-29 DIAGNOSIS — D510 Vitamin B12 deficiency anemia due to intrinsic factor deficiency: Secondary | ICD-10-CM | POA: Diagnosis not present

## 2021-07-29 DIAGNOSIS — Z0000 Encounter for general adult medical examination without abnormal findings: Secondary | ICD-10-CM | POA: Diagnosis not present

## 2021-07-29 DIAGNOSIS — Z125 Encounter for screening for malignant neoplasm of prostate: Secondary | ICD-10-CM | POA: Diagnosis not present

## 2021-07-29 DIAGNOSIS — E785 Hyperlipidemia, unspecified: Secondary | ICD-10-CM | POA: Diagnosis not present

## 2021-07-29 DIAGNOSIS — K219 Gastro-esophageal reflux disease without esophagitis: Secondary | ICD-10-CM | POA: Diagnosis not present

## 2021-07-29 DIAGNOSIS — F419 Anxiety disorder, unspecified: Secondary | ICD-10-CM | POA: Diagnosis not present

## 2021-07-30 DIAGNOSIS — D510 Vitamin B12 deficiency anemia due to intrinsic factor deficiency: Secondary | ICD-10-CM | POA: Diagnosis not present

## 2021-07-30 DIAGNOSIS — J309 Allergic rhinitis, unspecified: Secondary | ICD-10-CM | POA: Diagnosis not present

## 2021-08-25 DIAGNOSIS — K219 Gastro-esophageal reflux disease without esophagitis: Secondary | ICD-10-CM | POA: Diagnosis not present

## 2021-08-27 DIAGNOSIS — H401212 Low-tension glaucoma, right eye, moderate stage: Secondary | ICD-10-CM | POA: Diagnosis not present

## 2021-08-27 DIAGNOSIS — H401221 Low-tension glaucoma, left eye, mild stage: Secondary | ICD-10-CM | POA: Diagnosis not present

## 2021-09-06 DIAGNOSIS — E538 Deficiency of other specified B group vitamins: Secondary | ICD-10-CM | POA: Diagnosis not present

## 2021-09-30 DIAGNOSIS — D124 Benign neoplasm of descending colon: Secondary | ICD-10-CM | POA: Diagnosis not present

## 2021-09-30 DIAGNOSIS — K5730 Diverticulosis of large intestine without perforation or abscess without bleeding: Secondary | ICD-10-CM | POA: Diagnosis not present

## 2021-09-30 DIAGNOSIS — Z86010 Personal history of colonic polyps: Secondary | ICD-10-CM | POA: Diagnosis not present

## 2021-09-30 DIAGNOSIS — Z79899 Other long term (current) drug therapy: Secondary | ICD-10-CM | POA: Diagnosis not present

## 2021-09-30 DIAGNOSIS — D126 Benign neoplasm of colon, unspecified: Secondary | ICD-10-CM | POA: Diagnosis not present

## 2021-09-30 DIAGNOSIS — Z800 Family history of malignant neoplasm of digestive organs: Secondary | ICD-10-CM | POA: Diagnosis not present

## 2021-09-30 DIAGNOSIS — K635 Polyp of colon: Secondary | ICD-10-CM | POA: Diagnosis not present

## 2021-10-05 DIAGNOSIS — E538 Deficiency of other specified B group vitamins: Secondary | ICD-10-CM | POA: Diagnosis not present

## 2021-10-12 ENCOUNTER — Ambulatory Visit: Payer: PPO | Admitting: Cardiology

## 2021-10-12 ENCOUNTER — Encounter: Payer: Self-pay | Admitting: Cardiology

## 2021-10-12 VITALS — BP 134/70 | HR 71 | Ht 66.0 in | Wt 170.4 lb

## 2021-10-12 DIAGNOSIS — I2510 Atherosclerotic heart disease of native coronary artery without angina pectoris: Secondary | ICD-10-CM

## 2021-10-12 DIAGNOSIS — R072 Precordial pain: Secondary | ICD-10-CM

## 2021-10-12 DIAGNOSIS — E782 Mixed hyperlipidemia: Secondary | ICD-10-CM

## 2021-10-12 NOTE — Progress Notes (Signed)
Kg   

## 2021-10-12 NOTE — Progress Notes (Signed)
Cardiology Office Note:    Date:  10/12/2021   ID:  Oscar Melton, DOB 1951-03-23, MRN [id number]  PCP:  Angelina Sheriff, MD  Cardiologist:  Jenne Campus, MD    Referring MD: Angelina Sheriff, MD   Chief Complaint  Patient presents with   Follow-up    History of Present Illness:    Oscar Melton is a 71 y.o. male with past medical history significant for dyslipidemia, essential hypertension, coronary artery disease with mild disease noted on the coronary CT angio done in February 27, 2020.  He comes today to my office for follow-up after 1 year he is still seeing Dr.Tobb.  Overall doing well denies have any chest pain tightness squeezing pressure mid chest.  He can climb stairs with no difficulties.  Overall doing well have no difficulty tolerating statin now.  Past Medical History:  Diagnosis Date   Borderline high cholesterol    Glaucoma     Past Surgical History:  Procedure Laterality Date   CATARACT EXTRACTION, BILATERAL      Current Medications: Current Meds  Medication Sig   aspirin EC 81 MG tablet Take 1 tablet (81 mg total) by mouth daily. Swallow whole.   COLLAGEN PO Take 1 tablet by mouth daily. Strength unknown   Cyanocobalamin (B-12 COMPLIANCE INJECTION IJ) Inject as directed every 30 (thirty) days.   ezetimibe (ZETIA) 10 MG tablet Take 1 tablet (10 mg total) by mouth daily.   famotidine (PEPCID) 20 MG tablet Take 20 mg by mouth daily.   GLUCOSAMINE HCL PO Take 1 tablet by mouth daily at 6 (six) AM. Unknown strength   loratadine (CLARITIN) 10 MG tablet Take 10 mg by mouth daily.   nitroGLYCERIN (NITROSTAT) 0.4 MG SL tablet Place 1 tablet (0.4 mg total) under the tongue every 5 (five) minutes as needed for chest pain.   rosuvastatin (CRESTOR) 10 MG tablet Take 10 mg by mouth daily.   Vitamin D, Ergocalciferol, (DRISDOL) 1.25 MG (50000 UNIT) CAPS capsule Take 1 capsule (50,000 Units total) by mouth every 7 (seven) days. Once weekly.   Zinc 50 MG CAPS  Take 50 mg by mouth.     Allergies:   Lipitor [atorvastatin]   Social History   Socioeconomic History   Marital status: Single    Spouse name: Not on file   Number of children: Not on file   Years of education: Not on file   Highest education level: Not on file  Occupational History   Not on file  Tobacco Use   Smoking status: Former   Smokeless tobacco: Never  Vaping Use   Vaping Use: Never used  Substance and Sexual Activity   Alcohol use: Never   Drug use: Not on file   Sexual activity: Never  Other Topics Concern   Not on file  Social History Narrative   Right handed    Lives alone in apartment    Social Determinants of Health   Financial Resource Strain: Not on file  Food Insecurity: Not on file  Transportation Needs: Not on file  Physical Activity: Not on file  Stress: Not on file  Social Connections: Not on file     Family History: The patient's family history includes AAA (abdominal aortic aneurysm) in his father; Cancer - Colon in his mother; Heart attack in his sister; Stroke in his maternal grandmother and sister; Suicidality in his brother. ROS:   Please see the history of present illness.    All 14 point  review of systems negative except as described per history of present illness  EKGs/Labs/Other Studies Reviewed:      Recent Labs: No results found for requested labs within last 8760 hours.  Recent Lipid Panel    Component Value Date/Time   CHOL 181 04/16/2020 0955   TRIG 86 04/16/2020 0955   HDL 46 04/16/2020 0955   CHOLHDL 3.9 04/16/2020 0955   LDLCALC 119 (H) 04/16/2020 0955    Physical Exam:    VS:  BP 134/70 (BP Location: Left Arm, Patient Position: Sitting)   Pulse 71   Ht '5\' 6"'$  (1.676 m)   Wt 170 lb 6.4 oz (77.3 kg)   SpO2 93%   BMI 27.50 kg/m     Wt Readings from Last 3 Encounters:  10/12/21 170 lb 6.4 oz (77.3 kg)  10/27/20 169 lb 9.6 oz (76.9 kg)  10/15/20 169 lb 3.2 oz (76.7 kg)     GEN:  Well nourished, well  developed in no acute distress HEENT: Normal NECK: No JVD; No carotid bruits LYMPHATICS: No lymphadenopathy CARDIAC: RRR, no murmurs, no rubs, no gallops RESPIRATORY:  Clear to auscultation without rales, wheezing or rhonchi  ABDOMEN: Soft, non-tender, non-distended MUSCULOSKELETAL:  No edema; No deformity  SKIN: Warm and dry LOWER EXTREMITIES: no swelling NEUROLOGIC:  Alert and oriented x 3 PSYCHIATRIC:  Normal affect   ASSESSMENT:    1. Precordial pain   2. Coronary artery disease involving native coronary artery of native heart without angina pectoris   3. Mixed hyperlipidemia    PLAN:    In order of problems listed above:  Precordial chest pain denies having any Coronary artery disease nonobstructive as proven by fractional flow reserve done in October 2021.  We will continue risk factors modifications which include antiplatelets therapy is on aspirin which I will continue as well as statin Mixed dyslipidemia I did review K PN which show me his total cholesterol 115 with HDL 33 good cholesterol profile he is already on Crestor 10 and Zetia which I will continue   Medication Adjustments/Labs and Tests Ordered: Current medicines are reviewed at length with the patient today.  Concerns regarding medicines are outlined above.  No orders of the defined types were placed in this encounter.  Medication changes: No orders of the defined types were placed in this encounter.   Signed, Park Liter, MD, [facility] 10/12/2021 2:25 PM    [facility]

## 2021-10-12 NOTE — Patient Instructions (Signed)

## 2021-10-13 ENCOUNTER — Other Ambulatory Visit: Payer: Self-pay | Admitting: Cardiology

## 2021-11-05 DIAGNOSIS — D519 Vitamin B12 deficiency anemia, unspecified: Secondary | ICD-10-CM | POA: Diagnosis not present

## 2021-11-27 IMAGING — CT CT HEART MORP W/ CTA COR W/ SCORE W/ CA W/CM &/OR W/O CM
4 of 7 series · 8 of 20 positions shown, 9 images · IV contrast (APPLIED)
Comparison: None.
COMPARISON: None.

Addendum:
EXAM:
OVER-READ INTERPRETATION  CT CHEST

The following report is an over-read performed by radiologist Dr.
Hyunsik Junker [REDACTED] on 02/05/2020. This
over-read does not include interpretation of cardiac or coronary
anatomy or pathology. The coronary calcium score/coronary CTA
interpretation by the cardiologist is attached.
CLINICAL DATA: 68 year old female with chest pain. Medical history
includes Hyperlipidemia.
Cardiac/Coronary  CT
TECHNIQUE: The patient was scanned on a [employer] Force scanner.

[Series 6: best diast 68 % · axial · 0.39mm/px · z∈[-218,-178]mm · 2 of 303 slices shown]
[im 101/303  vessel]
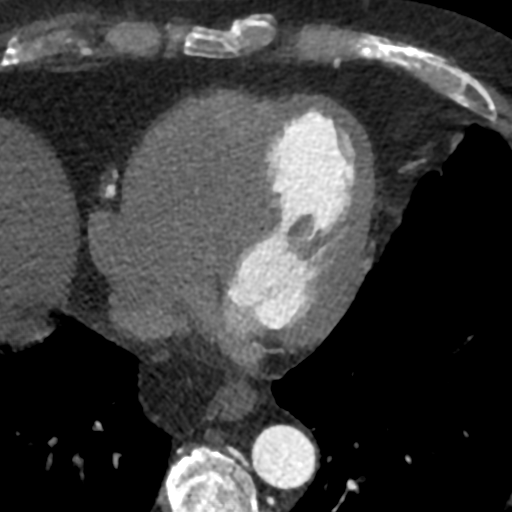
[im 202/303  vessel]
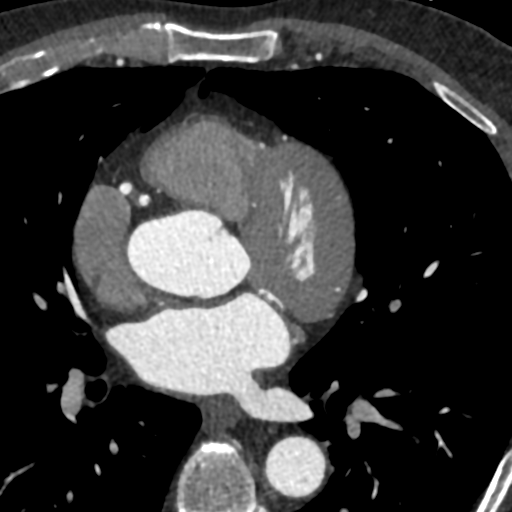

[Series 7: best syst · axial · 0.39mm/px · z∈[-218,-178]mm · 2 of 303 slices shown, 3 images]
[im 101/303  vessel]
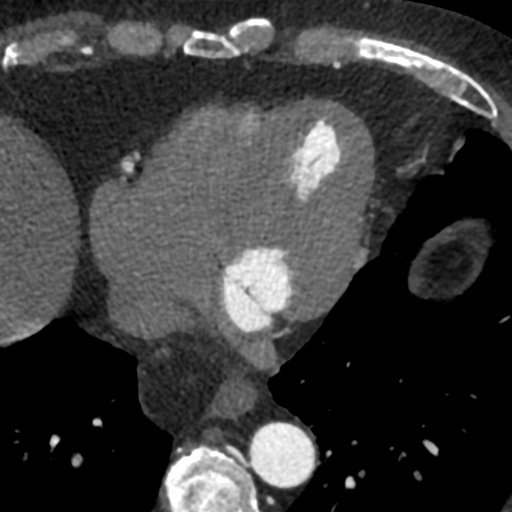
[im 101/303  lung]
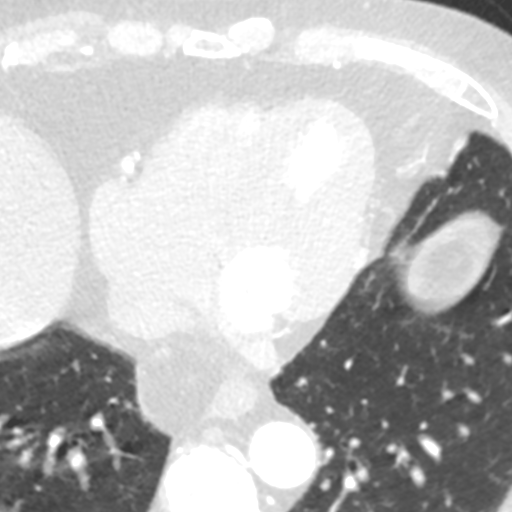
[im 202/303  vessel]
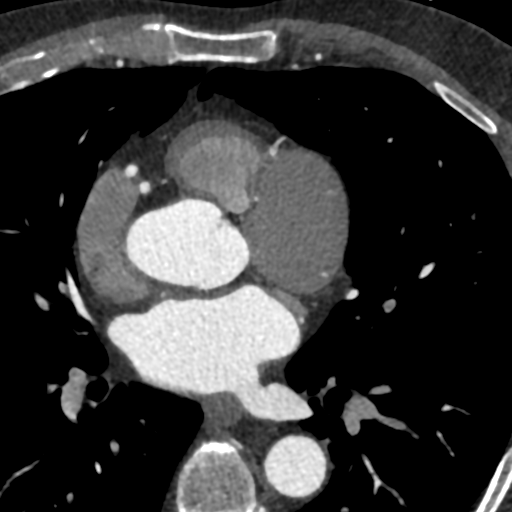

[Series 9: ts diast sharp 68 % · axial · 0.39mm/px · z∈[-218,-178]mm · 2 of 303 slices shown]
[im 101/303  lung]
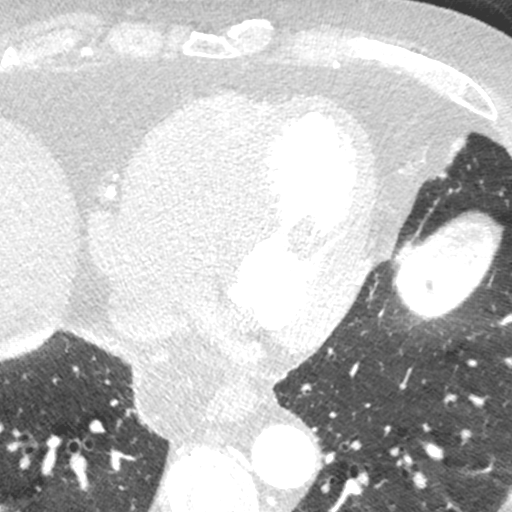
[im 202/303  lung]
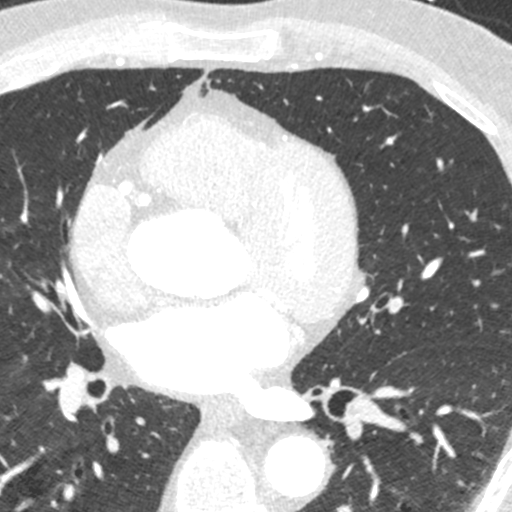

[Series 10: ts syst sharp · axial · 0.39mm/px · z∈[-218,-178]mm · 2 of 303 slices shown]
[im 101/303  lung]
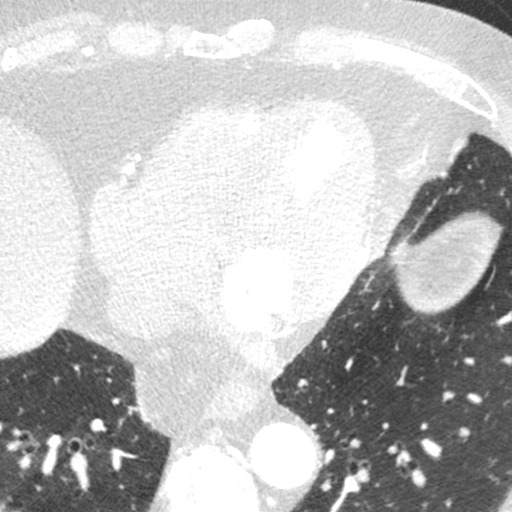
[im 202/303  lung]
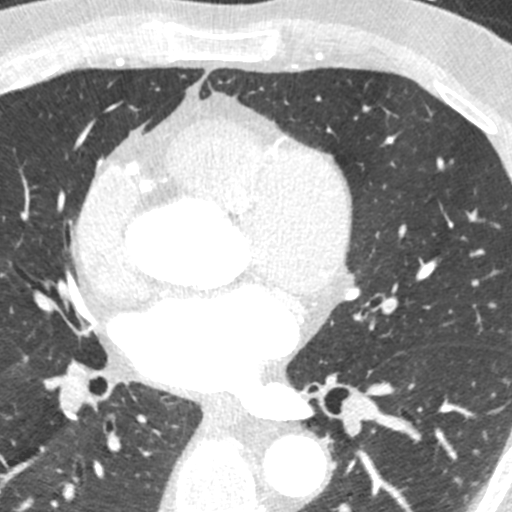

[8 of 20 positions shown; findings below may reference images not displayed]

FINDINGS: Small hiatal hernia. Within the visualized portions of the thorax
there are no suspicious appearing pulmonary nodules or masses, there
is no acute consolidative airspace disease, no pleural effusions, no
pneumothorax and no lymphadenopathy. Visualized portions of the
upper abdomen are unremarkable. There are no aggressive appearing
lytic or blastic lesions noted in the visualized portions of the
skeleton.
IMPRESSION: No significant incidental noncardiac findings are noted.
FINDINGS: A 120 kV prospective scan was triggered in the descending thoracic
aorta at 111 HU's. Axial non-contrast 3 mm slices were carried out
through the heart. The data set was analyzed on a dedicated work
station and scored using the Agatson method. Gantry rotation speed
was 250 msecs and collimation was .6 mm. No beta blockade and 0.8 mg
of sl NTG was given. The 3D data set was reconstructed in 5%
intervals of the 67-82 % of the R-R cycle. Diastolic phases were
analyzed on a dedicated work station using MPR, MIP and VRT modes.
The patient received 80 cc of contrast.

Aorta: Normal size.  No calcifications.  No dissection.

Aortic Valve:  Trileaflet.  No calcifications.

Coronary Arteries:  Normal coronary origin.  Right dominance.

RCA is a large tortuous dominant artery that gives rise to PDA and
PLVB. There is a mild (25-49%) calcified plaque in the proximal
portion. Mild (25-49%) soft plaque in the mid RCA. Distal RCA with
minimal (<24%) soft plaque.

Left main is a large artery that gives rise to LAD and LCX arteries.

LAD is a large vessel. There is minimal (<24%) scattered calcified
plaques in the proximal LAD. The mid and distal LAD with no plaques.

LCX is a non-dominant artery that gives rise to one large OM1
branch. There is no plaque.

Other findings:

Normal pulmonary vein drainage into the left atrium.

Normal left atrial appendage without a thrombus.

Normal size of the pulmonary artery.
IMPRESSION: 1. Coronary calcium score of 148. This was 82 percentile for age and
sex matched control.

2. Normal coronary origin with right dominance.

3. Mild CAD. Recommend medical therapy for primary prevention. This
study will send for FFR due to suspicion that soft plaque in the mid
RCA may be causing more flow limitation due to bend at the level of
the lesion.

Dhyrma Guardarrama, DO

*** End of Addendum ***
EXAM:
OVER-READ INTERPRETATION  CT CHEST

The following report is an over-read performed by radiologist Dr.
Hyunsik Junker [REDACTED] on 02/05/2020. This
over-read does not include interpretation of cardiac or coronary
anatomy or pathology. The coronary calcium score/coronary CTA
interpretation by the cardiologist is attached.
FINDINGS: Small hiatal hernia. Within the visualized portions of the thorax
there are no suspicious appearing pulmonary nodules or masses, there
is no acute consolidative airspace disease, no pleural effusions, no
pneumothorax and no lymphadenopathy. Visualized portions of the
upper abdomen are unremarkable. There are no aggressive appearing
lytic or blastic lesions noted in the visualized portions of the
skeleton.
IMPRESSION: No significant incidental noncardiac findings are noted.

## 2021-12-02 DIAGNOSIS — D510 Vitamin B12 deficiency anemia due to intrinsic factor deficiency: Secondary | ICD-10-CM | POA: Diagnosis not present

## 2022-01-03 DIAGNOSIS — E538 Deficiency of other specified B group vitamins: Secondary | ICD-10-CM | POA: Diagnosis not present

## 2022-02-09 DIAGNOSIS — E538 Deficiency of other specified B group vitamins: Secondary | ICD-10-CM | POA: Diagnosis not present

## 2022-03-09 DIAGNOSIS — H401212 Low-tension glaucoma, right eye, moderate stage: Secondary | ICD-10-CM | POA: Diagnosis not present

## 2022-03-09 DIAGNOSIS — H401221 Low-tension glaucoma, left eye, mild stage: Secondary | ICD-10-CM | POA: Diagnosis not present

## 2022-03-10 DIAGNOSIS — E538 Deficiency of other specified B group vitamins: Secondary | ICD-10-CM | POA: Diagnosis not present

## 2022-04-13 DIAGNOSIS — D510 Vitamin B12 deficiency anemia due to intrinsic factor deficiency: Secondary | ICD-10-CM | POA: Diagnosis not present

## 2022-05-12 DIAGNOSIS — D510 Vitamin B12 deficiency anemia due to intrinsic factor deficiency: Secondary | ICD-10-CM | POA: Diagnosis not present

## 2022-06-20 DIAGNOSIS — E538 Deficiency of other specified B group vitamins: Secondary | ICD-10-CM | POA: Diagnosis not present

## 2022-07-09 DIAGNOSIS — R0981 Nasal congestion: Secondary | ICD-10-CM | POA: Diagnosis not present

## 2022-07-09 DIAGNOSIS — R059 Cough, unspecified: Secondary | ICD-10-CM | POA: Diagnosis not present

## 2022-07-22 DIAGNOSIS — D510 Vitamin B12 deficiency anemia due to intrinsic factor deficiency: Secondary | ICD-10-CM | POA: Diagnosis not present

## 2022-08-02 DIAGNOSIS — Z6826 Body mass index (BMI) 26.0-26.9, adult: Secondary | ICD-10-CM | POA: Diagnosis not present

## 2022-08-02 DIAGNOSIS — Z79899 Other long term (current) drug therapy: Secondary | ICD-10-CM | POA: Diagnosis not present

## 2022-08-02 DIAGNOSIS — H409 Unspecified glaucoma: Secondary | ICD-10-CM | POA: Diagnosis not present

## 2022-08-02 DIAGNOSIS — Z0000 Encounter for general adult medical examination without abnormal findings: Secondary | ICD-10-CM | POA: Diagnosis not present

## 2022-08-02 DIAGNOSIS — I2510 Atherosclerotic heart disease of native coronary artery without angina pectoris: Secondary | ICD-10-CM | POA: Diagnosis not present

## 2022-08-02 DIAGNOSIS — M1990 Unspecified osteoarthritis, unspecified site: Secondary | ICD-10-CM | POA: Diagnosis not present

## 2022-08-02 DIAGNOSIS — E7800 Pure hypercholesterolemia, unspecified: Secondary | ICD-10-CM | POA: Diagnosis not present

## 2022-08-02 DIAGNOSIS — D510 Vitamin B12 deficiency anemia due to intrinsic factor deficiency: Secondary | ICD-10-CM | POA: Diagnosis not present

## 2022-08-24 DIAGNOSIS — H401212 Low-tension glaucoma, right eye, moderate stage: Secondary | ICD-10-CM | POA: Diagnosis not present

## 2022-08-24 DIAGNOSIS — E538 Deficiency of other specified B group vitamins: Secondary | ICD-10-CM | POA: Diagnosis not present

## 2022-08-24 DIAGNOSIS — H401221 Low-tension glaucoma, left eye, mild stage: Secondary | ICD-10-CM | POA: Diagnosis not present

## 2022-09-22 DIAGNOSIS — E538 Deficiency of other specified B group vitamins: Secondary | ICD-10-CM | POA: Diagnosis not present

## 2022-10-20 ENCOUNTER — Ambulatory Visit: Payer: PPO | Admitting: Cardiology

## 2022-10-24 ENCOUNTER — Ambulatory Visit: Payer: PPO | Attending: Cardiology | Admitting: Cardiology

## 2022-10-24 ENCOUNTER — Encounter: Payer: Self-pay | Admitting: Cardiology

## 2022-10-24 VITALS — BP 128/78 | HR 72 | Ht 66.0 in | Wt 170.0 lb

## 2022-10-24 DIAGNOSIS — E789 Disorder of lipoprotein metabolism, unspecified: Secondary | ICD-10-CM

## 2022-10-24 DIAGNOSIS — I2510 Atherosclerotic heart disease of native coronary artery without angina pectoris: Secondary | ICD-10-CM

## 2022-10-24 DIAGNOSIS — E782 Mixed hyperlipidemia: Secondary | ICD-10-CM | POA: Diagnosis not present

## 2022-10-24 DIAGNOSIS — R030 Elevated blood-pressure reading, without diagnosis of hypertension: Secondary | ICD-10-CM

## 2022-10-24 DIAGNOSIS — E538 Deficiency of other specified B group vitamins: Secondary | ICD-10-CM | POA: Diagnosis not present

## 2022-10-24 NOTE — Addendum Note (Signed)
Addended by: Baldo Ash D on: 10/24/2022 08:34 AM   Modules accepted: Orders

## 2022-10-24 NOTE — Progress Notes (Signed)
Cardiology Office Note:    Date:  10/24/2022   ID:  Oscar Melton, DOB 1951-03-10, MRN [id number]  PCP:  Noni Saupe, MD  Cardiologist:  Gypsy Balsam, MD    Referring MD: Noni Saupe, MD   Chief Complaint  Patient presents with   Follow-up  Doing fine  History of Present Illness:    Oscar Melton is a 72 y.o. male past medical history significant for dyslipidemia, essential hypertension, coronary artery disease in form of mild coronary artery disease as noted on the coronary CT angio done in February 27, 2020.  He comes today for yearly follow-up.  He seems to be doing very well.  He still works works with quite a lot of walking and have no difficulty doing it.  Denies have any chest pain tightness squeezing pressure burning chest no shortness of breath.  Past Medical History:  Diagnosis Date   Borderline high cholesterol    Glaucoma     Past Surgical History:  Procedure Laterality Date   CATARACT EXTRACTION, BILATERAL      Current Medications: Current Meds  Medication Sig   aspirin EC 81 MG tablet Take 1 tablet (81 mg total) by mouth daily. Swallow whole.   COLLAGEN PO Take 1 tablet by mouth daily. Strength unknown   Cyanocobalamin (B-12 COMPLIANCE INJECTION IJ) Inject as directed every 30 (thirty) days.   ezetimibe (ZETIA) 10 MG tablet Take 1 tablet (10 mg total) by mouth daily.   famotidine (PEPCID) 20 MG tablet Take 20 mg by mouth daily.   GLUCOSAMINE HCL PO Take 1 tablet by mouth daily at 6 (six) AM. Unknown strength   loratadine (CLARITIN) 10 MG tablet Take 10 mg by mouth daily.   rosuvastatin (CRESTOR) 10 MG tablet Take 10 mg by mouth daily.   Vitamin D, Ergocalciferol, (DRISDOL) 1.25 MG (50000 UNIT) CAPS capsule Take 1 capsule (50,000 Units total) by mouth every 7 (seven) days. Once weekly.   Zinc 50 MG CAPS Take 50 mg by mouth.     Allergies:   Lipitor [atorvastatin]   Social History   Socioeconomic History   Marital status: Single     Spouse name: Not on file   Number of children: Not on file   Years of education: Not on file   Highest education level: Not on file  Occupational History   Not on file  Tobacco Use   Smoking status: Former   Smokeless tobacco: Never  Vaping Use   Vaping Use: Never used  Substance and Sexual Activity   Alcohol use: Never   Drug use: Not on file   Sexual activity: Never  Other Topics Concern   Not on file  Social History Narrative   Right handed    Lives alone in apartment    Social Determinants of Health   Financial Resource Strain: Not on file  Food Insecurity: Not on file  Transportation Needs: Not on file  Physical Activity: Not on file  Stress: Not on file  Social Connections: Not on file     Family History: The patient's family history includes AAA (abdominal aortic aneurysm) in his father; Cancer - Colon in his mother; Heart attack in his sister; Stroke in his maternal grandmother and sister; Suicidality in his brother. ROS:   Please see the history of present illness.    All 14 point review of systems negative except as described per history of present illness  EKGs/Labs/Other Studies Reviewed:      Recent Labs:  No results found for requested labs within last 365 days.  Recent Lipid Panel    Component Value Date/Time   CHOL 181 04/16/2020 0955   TRIG 86 04/16/2020 0955   HDL 46 04/16/2020 0955   CHOLHDL 3.9 04/16/2020 0955   LDLCALC 119 (H) 04/16/2020 0955    Physical Exam:    VS:  BP (!) 140/80 (BP Location: Left Arm, Patient Position: Sitting)   Pulse 72   Ht 5\' 6"  (1.676 m)   Wt 170 lb (77.1 kg)   SpO2 96%   BMI 27.44 kg/m     Wt Readings from Last 3 Encounters:  10/24/22 170 lb (77.1 kg)  10/12/21 170 lb 6.4 oz (77.3 kg)  10/27/20 169 lb 9.6 oz (76.9 kg)     GEN:  Well nourished, well developed in no acute distress HEENT: Normal NECK: No JVD; No carotid bruits LYMPHATICS: No lymphadenopathy CARDIAC: RRR, no murmurs, no rubs, no  gallops RESPIRATORY:  Clear to auscultation without rales, wheezing or rhonchi  ABDOMEN: Soft, non-tender, non-distended MUSCULOSKELETAL:  No edema; No deformity  SKIN: Warm and dry LOWER EXTREMITIES: no swelling NEUROLOGIC:  Alert and oriented x 3 PSYCHIATRIC:  Normal affect   ASSESSMENT:    1. Coronary artery disease involving native coronary artery of native heart without angina pectoris   2. Mixed hyperlipidemia   3. Elevated blood pressure reading   4. Borderline high cholesterol    PLAN:    In order of problems listed above:  Coronary disease stable from that point review on guideline directed medical therapy which include antiplatelets therapy as well as statin. Mixed dyslipidemia I do have blood test from March 2024 KPN showed total cholesterol 129 HDL 42, will do fasting lipid profile today. Essential hypertension.  Blood pressure on the higher side today we will check before he leaves the room. First-degree AV block noted on the monitor however he does not have any dizziness no palpitations no passing out.  Will continue present management.   Medication Adjustments/Labs and Tests Ordered: Current medicines are reviewed at length with the patient today.  Concerns regarding medicines are outlined above.  No orders of the defined types were placed in this encounter.  Medication changes: No orders of the defined types were placed in this encounter.   Signed, Georgeanna Lea, MD, [facility] 10/24/2022 8:23 AM    [facility]

## 2022-10-24 NOTE — Addendum Note (Signed)
Addended by: Eleonore Chiquito on: 10/24/2022 04:41 PM   Modules accepted: Orders

## 2022-10-24 NOTE — Patient Instructions (Addendum)
Medication Instructions:  Your physician recommends that you continue on your current medications as directed. Please refer to the Current Medication list given to you today.  *If you need a refill on your cardiac medications before your next appointment, please call your pharmacy*   Lab Work: Lipid, AST, ALT, A1C- today If you have labs (blood work) drawn today and your tests are completely normal, you will receive your results only by: MyChart Message (if you have MyChart) OR A paper copy in the mail If you have any lab test that is abnormal or we need to change your treatment, we will call you to review the results.   Testing/Procedures: None Ordered   Follow-Up: At [facility], you and your health needs are our priority.  As part of our continuing mission to provide you with exceptional heart care, we have created designated Provider Care Teams.  These Care Teams include your primary Cardiologist (physician) and Advanced Practice Providers (APPs -  Physician Assistants and Nurse Practitioners) who all work together to provide you with the care you need, when you need it.  We recommend signing up for the patient portal called "MyChart".  Sign up information is provided on this After Visit Summary.  MyChart is used to connect with patients for Virtual Visits (Telemedicine).  Patients are able to view lab/test results, encounter notes, upcoming appointments, etc.  Non-urgent messages can be sent to your provider as well.   To learn more about what you can do with MyChart, go to [email or web address].    Your next appointment:   12 month(s)  The format for your next appointment:   In Person  Provider:   Gypsy Balsam, MD    Other Instructions NA

## 2022-10-25 LAB — LIPID PANEL
Chol/HDL Ratio: 2.7 ratio (ref 0.0–5.0)
Cholesterol, Total: 126 mg/dL (ref 100–199)
HDL: 46 mg/dL (ref >39)
LDL Chol Calc (NIH): 68 mg/dL (ref 0–99)
Triglycerides: 55 mg/dL (ref 0–149)
VLDL Cholesterol Cal: 12 mg/dL (ref 5–40)

## 2022-10-25 LAB — HEMOGLOBIN A1C
Est. average glucose Bld gHb Est-mCnc: 117 mg/dL
Hgb A1c MFr Bld: 5.7 % — ABNORMAL HIGH (ref 4.8–5.6)

## 2022-10-25 LAB — AST: AST: 13 IU/L (ref 0–40)

## 2022-10-25 LAB — ALT: ALT: 11 IU/L (ref 0–44)

## 2022-10-28 ENCOUNTER — Telehealth: Payer: Self-pay

## 2022-10-28 NOTE — Telephone Encounter (Signed)
Left message on My Chart with normal results per Dr. Krasowski's note. Routed to PCP. 

## 2022-11-22 DIAGNOSIS — D519 Vitamin B12 deficiency anemia, unspecified: Secondary | ICD-10-CM | POA: Diagnosis not present

## 2022-12-05 DIAGNOSIS — L820 Inflamed seborrheic keratosis: Secondary | ICD-10-CM | POA: Diagnosis not present

## 2022-12-19 DIAGNOSIS — D519 Vitamin B12 deficiency anemia, unspecified: Secondary | ICD-10-CM | POA: Diagnosis not present

## 2023-02-01 DIAGNOSIS — D519 Vitamin B12 deficiency anemia, unspecified: Secondary | ICD-10-CM | POA: Diagnosis not present

## 2023-03-01 DIAGNOSIS — H401212 Low-tension glaucoma, right eye, moderate stage: Secondary | ICD-10-CM | POA: Diagnosis not present

## 2023-03-01 DIAGNOSIS — H401221 Low-tension glaucoma, left eye, mild stage: Secondary | ICD-10-CM | POA: Diagnosis not present

## 2023-03-03 DIAGNOSIS — D510 Vitamin B12 deficiency anemia due to intrinsic factor deficiency: Secondary | ICD-10-CM | POA: Diagnosis not present

## 2023-03-16 ENCOUNTER — Encounter: Payer: Self-pay | Admitting: Cardiology

## 2023-04-03 DIAGNOSIS — D519 Vitamin B12 deficiency anemia, unspecified: Secondary | ICD-10-CM | POA: Diagnosis not present

## 2023-05-03 DIAGNOSIS — E538 Deficiency of other specified B group vitamins: Secondary | ICD-10-CM | POA: Diagnosis not present

## 2023-06-05 DIAGNOSIS — D519 Vitamin B12 deficiency anemia, unspecified: Secondary | ICD-10-CM | POA: Diagnosis not present

## 2023-08-03 DIAGNOSIS — Z0000 Encounter for general adult medical examination without abnormal findings: Secondary | ICD-10-CM | POA: Diagnosis not present

## 2023-08-03 DIAGNOSIS — Z79899 Other long term (current) drug therapy: Secondary | ICD-10-CM | POA: Diagnosis not present

## 2023-08-03 DIAGNOSIS — Z1339 Encounter for screening examination for other mental health and behavioral disorders: Secondary | ICD-10-CM | POA: Diagnosis not present

## 2023-08-03 DIAGNOSIS — E538 Deficiency of other specified B group vitamins: Secondary | ICD-10-CM | POA: Diagnosis not present

## 2023-08-03 DIAGNOSIS — I2510 Atherosclerotic heart disease of native coronary artery without angina pectoris: Secondary | ICD-10-CM | POA: Diagnosis not present

## 2023-08-03 DIAGNOSIS — Z6826 Body mass index (BMI) 26.0-26.9, adult: Secondary | ICD-10-CM | POA: Diagnosis not present

## 2023-08-03 DIAGNOSIS — R42 Dizziness and giddiness: Secondary | ICD-10-CM | POA: Diagnosis not present

## 2023-08-03 DIAGNOSIS — Z9181 History of falling: Secondary | ICD-10-CM | POA: Diagnosis not present

## 2023-08-03 DIAGNOSIS — Z136 Encounter for screening for cardiovascular disorders: Secondary | ICD-10-CM | POA: Diagnosis not present

## 2023-08-03 DIAGNOSIS — E7800 Pure hypercholesterolemia, unspecified: Secondary | ICD-10-CM | POA: Diagnosis not present

## 2023-08-03 DIAGNOSIS — Z1331 Encounter for screening for depression: Secondary | ICD-10-CM | POA: Diagnosis not present

## 2023-08-18 DIAGNOSIS — Z136 Encounter for screening for cardiovascular disorders: Secondary | ICD-10-CM | POA: Diagnosis not present

## 2023-08-30 DIAGNOSIS — H401212 Low-tension glaucoma, right eye, moderate stage: Secondary | ICD-10-CM | POA: Diagnosis not present

## 2023-08-30 DIAGNOSIS — H401221 Low-tension glaucoma, left eye, mild stage: Secondary | ICD-10-CM | POA: Diagnosis not present

## 2023-10-19 ENCOUNTER — Ambulatory Visit: Attending: Cardiology | Admitting: Cardiology

## 2023-10-19 VITALS — BP 132/78 | HR 62 | Ht 66.0 in | Wt 168.4 lb

## 2023-10-19 DIAGNOSIS — R072 Precordial pain: Secondary | ICD-10-CM | POA: Diagnosis not present

## 2023-10-19 DIAGNOSIS — N3943 Post-void dribbling: Secondary | ICD-10-CM

## 2023-10-19 DIAGNOSIS — I2510 Atherosclerotic heart disease of native coronary artery without angina pectoris: Secondary | ICD-10-CM

## 2023-10-19 DIAGNOSIS — E782 Mixed hyperlipidemia: Secondary | ICD-10-CM

## 2023-10-19 DIAGNOSIS — D649 Anemia, unspecified: Secondary | ICD-10-CM

## 2023-10-19 DIAGNOSIS — R030 Elevated blood-pressure reading, without diagnosis of hypertension: Secondary | ICD-10-CM

## 2023-10-19 MED ORDER — EZETIMIBE 10 MG PO TABS: 10.0000 mg | ORAL_TABLET | Freq: Every day | ORAL | 3 refills | Status: AC

## 2023-10-19 NOTE — Patient Instructions (Signed)
 Medication Instructions:  Your physician recommends that you continue on your current medications as directed. Please refer to the Current Medication list given to you today.  *If you need a refill on your cardiac medications before your next appointment, please call your pharmacy*   Lab Work: Your physician recommends that you return for lab work in: 1 month You need to have labs done when you are fasting.  You can come Monday through Friday 8:30 am to 12:00 pm and 1:15 to 4:30. You do not need to make an appointment as the order has already been placed. The labs you are going to have done are B12, PSA.    Testing/Procedures: None Ordered   Follow-Up: At [facility], you and your health needs are our priority.  As part of our continuing mission to provide you with exceptional heart care, we have created designated Provider Care Teams.  These Care Teams include your primary Cardiologist (physician) and Advanced Practice Providers (APPs -  Physician Assistants and Nurse Practitioners) who all work together to provide you with the care you need, when you need it.  We recommend signing up for the patient portal called "MyChart".  Sign up information is provided on this After Visit Summary.  MyChart is used to connect with patients for Virtual Visits (Telemedicine).  Patients are able to view lab/test results, encounter notes, upcoming appointments, etc.  Non-urgent messages can be sent to your provider as well.   To learn more about what you can do with MyChart, go to [email or web address].    Your next appointment:   12 month(s)  The format for your next appointment:   In Person  Provider:   Ralene Burger, MD    Other Instructions NA

## 2023-10-19 NOTE — Progress Notes (Signed)
 Cardiology Office Note:    Date:  10/19/2023   ID:  Oscar Melton, DOB 03/10/1951, MRN [id number]  PCP:  Russell Court, DO  Cardiologist:  Ralene Burger, MD    Referring MD: Madelyne Schiff, MD   Chief Complaint  Patient presents with   Follow-up    History of Present Illness:    Oscar Melton is a 73 y.o. male past medical history significant for dyslipidemia, essential hypertension, coronary disease in form of mild coronary artery disease noted on coronary CT angio done in February 27, 2020.  Comes today 2 months for follow-up.  Cardiac wise doing well.  He is completely asymptomatic.  He denies have any chest pain tightness squeezing pressure burning chest.  Still works 3 to 4 days a week in [employer] he likes this that work involves a lot of walking and carrying stuff he had no difficulty doing it.  Past Medical History:  Diagnosis Date   Borderline high cholesterol    Glaucoma     Past Surgical History:  Procedure Laterality Date   CATARACT EXTRACTION, BILATERAL      Current Medications: Current Meds  Medication Sig   aspirin  EC 81 MG tablet Take 1 tablet (81 mg total) by mouth daily. Swallow whole.   COLLAGEN PO Take 1 tablet by mouth daily. Strength unknown   Cyanocobalamin  (B-12 COMPLIANCE INJECTION IJ) Inject 1 Dose as directed every 30 (thirty) days.   ezetimibe  (ZETIA ) 10 MG tablet Take 1 tablet (10 mg total) by mouth daily.   famotidine (PEPCID) 20 MG tablet Take 20 mg by mouth daily.   GLUCOSAMINE HCL PO Take 1 tablet by mouth daily at 6 (six) AM. Unknown strength   loratadine (CLARITIN) 10 MG tablet Take 10 mg by mouth daily.   nitroGLYCERIN  (NITROSTAT ) 0.4 MG SL tablet Place 1 tablet (0.4 mg total) under the tongue every 5 (five) minutes as needed for chest pain.   rosuvastatin  (CRESTOR ) 10 MG tablet Take 10 mg by mouth daily.   Vitamin D , Ergocalciferol , (DRISDOL ) 1.25 MG (50000 UNIT) CAPS capsule Take 1 capsule (50,000 Units total) by mouth  every 7 (seven) days. Once weekly.   Zinc 50 MG CAPS Take 50 mg by mouth daily.     Allergies:   Lipitor [atorvastatin]   Social History   Socioeconomic History   Marital status: Single    Spouse name: Not on file   Number of children: Not on file   Years of education: Not on file   Highest education level: Not on file  Occupational History   Not on file  Tobacco Use   Smoking status: Former   Smokeless tobacco: Never  Vaping Use   Vaping status: Never Used  Substance and Sexual Activity   Alcohol use: Never   Drug use: Not on file   Sexual activity: Never  Other Topics Concern   Not on file  Social History Narrative   ** Merged History Encounter **       Right handed  Lives alone in apartment    Social Drivers of Health   Financial Resource Strain: Not on file  Food Insecurity: Not on file  Transportation Needs: Not on file  Physical Activity: Not on file  Stress: Not on file  Social Connections: Not on file     Family History: The patient's family history includes AAA (abdominal aortic aneurysm) in his father; Cancer - Colon in his mother; Heart attack in his sister; Stroke in  his maternal grandmother and sister; Suicidality in his brother. ROS:   Please see the history of present illness.    All 14 point review of systems negative except as described per history of present illness  EKGs/Labs/Other Studies Reviewed:         Recent Labs: 10/24/2022: ALT 11  Recent Lipid Panel    Component Value Date/Time   CHOL 126 10/24/2022 0845   TRIG 55 10/24/2022 0845   HDL 46 10/24/2022 0845   CHOLHDL 2.7 10/24/2022 0845   LDLCALC 68 10/24/2022 0845    Physical Exam:    VS:  BP 132/78 (BP Location: Right Arm, Patient Position: Sitting)   Pulse 62   Ht 5\' 6"  (1.676 m)   Wt 168 lb 6.4 oz (76.4 kg)   SpO2 95%   BMI 27.18 kg/m     Wt Readings from Last 3 Encounters:  10/19/23 168 lb 6.4 oz (76.4 kg)  10/24/22 170 lb (77.1 kg)  10/12/21 170 lb 6.4 oz  (77.3 kg)     GEN:  Well nourished, well developed in no acute distress HEENT: Normal NECK: No JVD; No carotid bruits LYMPHATICS: No lymphadenopathy CARDIAC: RRR, no murmurs, no rubs, no gallops RESPIRATORY:  Clear to auscultation without rales, wheezing or rhonchi  ABDOMEN: Soft, non-tender, non-distended MUSCULOSKELETAL:  No edema; No deformity  SKIN: Warm and dry LOWER EXTREMITIES: no swelling NEUROLOGIC:  Alert and oriented x 3 PSYCHIATRIC:  Normal affect   ASSESSMENT:    1. Elevated blood pressure reading   2. Coronary artery disease involving native coronary artery of native heart without angina pectoris   3. Mixed hyperlipidemia   4. Precordial pain    PLAN:    In order of problems listed above:  Coronary disease stable from that point review.  On appropriate guideline directed medical therapy which includes antiplatelet therapy and statin which I will continue. Dyslipidemia I did review K PN which show total cholesterol 154 HDL 44 LDL 68 we will continue Zetia  and Crestor  10.  He did have no tolerance issues with that. Essential hypertension blood pressure seems to well-controlled continue present management.  He would like me to check his vitamin B12 as well as PSA which I will do   Medication Adjustments/Labs and Tests Ordered: Current medicines are reviewed at length with the patient today.  Concerns regarding medicines are outlined above.  Orders Placed This Encounter  Procedures   EKG 12-Lead   Medication changes: No orders of the defined types were placed in this encounter.   Signed, Manfred Seed, MD, [facility] 10/19/2023 8:23 AM    [facility]

## 2023-10-19 NOTE — Addendum Note (Signed)
 Addended by: Shawnee Dellen D on: 10/19/2023 08:33 AM   Modules accepted: Orders

## 2024-03-19 DIAGNOSIS — H401212 Low-tension glaucoma, right eye, moderate stage: Secondary | ICD-10-CM | POA: Diagnosis not present

## 2024-03-19 DIAGNOSIS — H401221 Low-tension glaucoma, left eye, mild stage: Secondary | ICD-10-CM | POA: Diagnosis not present

## 2024-07-12 ENCOUNTER — Emergency Department (HOSPITAL_COMMUNITY)

## 2024-07-12 ENCOUNTER — Other Ambulatory Visit: Payer: Self-pay

## 2024-07-12 ENCOUNTER — Emergency Department (HOSPITAL_COMMUNITY)
Admission: EM | Admit: 2024-07-12 | Discharge: 2024-07-12 | Disposition: A | Discharge status: Home / Self Care | Attending: Emergency Medicine | Admitting: Emergency Medicine

## 2024-07-12 DIAGNOSIS — R55 Syncope and collapse: Secondary | ICD-10-CM | POA: Insufficient documentation

## 2024-07-12 DIAGNOSIS — Z7982 Long term (current) use of aspirin: Secondary | ICD-10-CM | POA: Diagnosis not present

## 2024-07-12 DIAGNOSIS — R569 Unspecified convulsions: Secondary | ICD-10-CM | POA: Diagnosis not present

## 2024-07-12 LAB — COMPREHENSIVE METABOLIC PANEL WITH GFR
ALT: 10 U/L (ref 0–44)
AST: 25 U/L (ref 15–41)
Albumin: 3.8 g/dL (ref 3.5–5.0)
Alkaline Phosphatase: 64 U/L (ref 38–126)
Anion gap: 11 (ref 5–15)
BUN: 12 mg/dL (ref 8–23)
CO2: 20 mmol/L — ABNORMAL LOW (ref 22–32)
Calcium: 8.6 mg/dL — ABNORMAL LOW (ref 8.9–10.3)
Chloride: 109 mmol/L (ref 98–111)
Creatinine, Ser: 1.04 mg/dL (ref 0.61–1.24)
GFR, Estimated: >60 mL/min
Glucose, Bld: 101 mg/dL — ABNORMAL HIGH (ref 70–99)
Potassium: 4.8 mmol/L (ref 3.5–5.1)
Sodium: 139 mmol/L (ref 135–145)
Total Bilirubin: 0.5 mg/dL (ref 0.0–1.2)
Total Protein: 6.7 g/dL (ref 6.5–8.1)

## 2024-07-12 LAB — CBC
HCT: 45.6 % (ref 39.0–52.0)
Hemoglobin: 15.1 g/dL (ref 13.0–17.0)
MCH: 30.1 pg (ref 26.0–34.0)
MCHC: 33.1 g/dL (ref 30.0–36.0)
MCV: 91 fL (ref 80.0–100.0)
Platelets: 153 10*3/uL (ref 150–400)
RBC: 5.01 MIL/uL (ref 4.22–5.81)
RDW: 13.2 % (ref 11.5–15.5)
WBC: 8.4 10*3/uL (ref 4.0–10.5)
nRBC: 0 % (ref 0.0–0.2)

## 2024-07-12 LAB — ETHANOL: Alcohol, Ethyl (B): <15 mg/dL

## 2024-07-12 MED ORDER — LEVETIRACETAM 500 MG PO TABS: 500.0000 mg | ORAL_TABLET | Freq: Two times a day (BID) | ORAL | 0 refills | Status: AC

## 2024-07-12 MED ORDER — LEVETIRACETAM (KEPPRA) 500 MG/5 ML ADULT IV PUSH
1000.0000 mg | Freq: Once | INTRAVENOUS | Status: AC
  Administered 2024-07-12: 1000 mg via INTRAVENOUS
  Filled 2024-07-12: qty 10

## 2024-07-15 ENCOUNTER — Encounter: Payer: Self-pay | Admitting: Neurology

## 2024-07-16 ENCOUNTER — Ambulatory Visit: Attending: Cardiology

## 2024-07-16 ENCOUNTER — Ambulatory Visit: Attending: Cardiology | Admitting: Cardiology

## 2024-07-16 ENCOUNTER — Encounter: Payer: Self-pay | Admitting: Cardiology

## 2024-07-16 VITALS — BP 120/78 | HR 69 | Ht 66.0 in | Wt 165.0 lb

## 2024-07-16 DIAGNOSIS — R55 Syncope and collapse: Secondary | ICD-10-CM | POA: Diagnosis not present

## 2024-07-16 DIAGNOSIS — E782 Mixed hyperlipidemia: Secondary | ICD-10-CM

## 2024-07-16 DIAGNOSIS — I2510 Atherosclerotic heart disease of native coronary artery without angina pectoris: Secondary | ICD-10-CM | POA: Diagnosis not present

## 2024-07-17 ENCOUNTER — Ambulatory Visit: Attending: Cardiology

## 2024-07-17 DIAGNOSIS — R55 Syncope and collapse: Secondary | ICD-10-CM

## 2024-07-17 LAB — ECHOCARDIOGRAM COMPLETE
AR max vel: 3.2 cm2
AV Area VTI: 3.32 cm2
AV Area mean vel: 3.15 cm2
AV Mean grad: 2 mmHg
AV Peak grad: 3.4 mmHg
Ao pk vel: 0.92 m/s
Area-P 1/2: 2.17 cm2
MV VTI: 2.15 cm2
P 1/2 time: 291 ms
S' Lateral: 2.8 cm

## 2024-07-19 ENCOUNTER — Ambulatory Visit: Payer: Self-pay | Admitting: Cardiology

## 2024-07-25 ENCOUNTER — Telehealth: Payer: Self-pay

## 2024-09-05 ENCOUNTER — Ambulatory Visit: Payer: Self-pay | Admitting: Neurology

## 2024-10-15 ENCOUNTER — Ambulatory Visit: Admitting: Cardiology
